# Patient Record
Sex: Male | Born: 1945 | Race: White | Hispanic: No | Marital: Single | Smoking: Former smoker
Health system: Southern US, Community
[De-identification: ages and names within clinical notes are randomized; demographics above are authoritative.]

## PROBLEM LIST (undated history)

## (undated) DIAGNOSIS — F32A Depression, unspecified: Secondary | ICD-10-CM

## (undated) DIAGNOSIS — K219 Gastro-esophageal reflux disease without esophagitis: Secondary | ICD-10-CM

## (undated) DIAGNOSIS — G459 Transient cerebral ischemic attack, unspecified: Secondary | ICD-10-CM

## (undated) DIAGNOSIS — E119 Type 2 diabetes mellitus without complications: Secondary | ICD-10-CM

## (undated) DIAGNOSIS — R7303 Prediabetes: Secondary | ICD-10-CM

## (undated) DIAGNOSIS — M1990 Unspecified osteoarthritis, unspecified site: Secondary | ICD-10-CM

## (undated) DIAGNOSIS — S12100A Unspecified displaced fracture of second cervical vertebra, initial encounter for closed fracture: Secondary | ICD-10-CM

## (undated) DIAGNOSIS — E7800 Pure hypercholesterolemia, unspecified: Secondary | ICD-10-CM

## (undated) DIAGNOSIS — H3530 Unspecified macular degeneration: Secondary | ICD-10-CM

## (undated) DIAGNOSIS — I10 Essential (primary) hypertension: Secondary | ICD-10-CM

## (undated) DIAGNOSIS — S069XAA Unspecified intracranial injury with loss of consciousness status unknown, initial encounter: Secondary | ICD-10-CM

## (undated) DIAGNOSIS — M109 Gout, unspecified: Secondary | ICD-10-CM

## (undated) DIAGNOSIS — F329 Major depressive disorder, single episode, unspecified: Secondary | ICD-10-CM

## (undated) HISTORY — PX: TRACHELECTOMY: SHX6586

## (undated) HISTORY — DX: Type 2 diabetes mellitus without complications: E11.9

## (undated) HISTORY — DX: Transient cerebral ischemic attack, unspecified: G45.9

## (undated) HISTORY — PX: EYE SURGERY: SHX253

## (undated) HISTORY — DX: Gout, unspecified: M10.9

## (undated) HISTORY — DX: Unspecified intracranial injury with loss of consciousness status unknown, initial encounter: S06.9XAA

## (undated) HISTORY — PX: BACK SURGERY: SHX140

---

## 2002-11-08 ENCOUNTER — Encounter: Admission: RE | Admit: 2002-11-08 | Discharge: 2002-11-08 | Payer: Self-pay | Admitting: Unknown Physician Specialty

## 2002-11-08 ENCOUNTER — Encounter: Payer: Self-pay | Admitting: Unknown Physician Specialty

## 2008-03-18 ENCOUNTER — Emergency Department (HOSPITAL_COMMUNITY): Admission: EM | Admit: 2008-03-18 | Discharge: 2008-03-18 | Payer: Self-pay | Admitting: Emergency Medicine

## 2014-12-26 ENCOUNTER — Inpatient Hospital Stay
Admission: AD | Admit: 2014-12-26 | Discharge: 2015-02-03 | Disposition: A | Discharge status: Skilled Nursing Facility | Payer: Self-pay | Source: Ambulatory Visit | Attending: Internal Medicine | Admitting: Internal Medicine

## 2014-12-26 ENCOUNTER — Institutional Professional Consult (permissible substitution) (HOSPITAL_COMMUNITY): Payer: Self-pay

## 2014-12-26 DIAGNOSIS — R1310 Dysphagia, unspecified: Secondary | ICD-10-CM

## 2014-12-26 DIAGNOSIS — J690 Pneumonitis due to inhalation of food and vomit: Secondary | ICD-10-CM

## 2014-12-26 DIAGNOSIS — J189 Pneumonia, unspecified organism: Secondary | ICD-10-CM

## 2014-12-26 DIAGNOSIS — J9690 Respiratory failure, unspecified, unspecified whether with hypoxia or hypercapnia: Secondary | ICD-10-CM

## 2014-12-26 DIAGNOSIS — Z431 Encounter for attention to gastrostomy: Secondary | ICD-10-CM | POA: Insufficient documentation

## 2014-12-26 DIAGNOSIS — W19XXXA Unspecified fall, initial encounter: Secondary | ICD-10-CM

## 2014-12-26 DIAGNOSIS — J9600 Acute respiratory failure, unspecified whether with hypoxia or hypercapnia: Secondary | ICD-10-CM | POA: Insufficient documentation

## 2014-12-26 LAB — BLOOD GAS, ARTERIAL
Acid-Base Excess: 2.6 mmol/L — ABNORMAL HIGH (ref 0.0–2.0)
BICARBONATE: 25.5 meq/L — AB (ref 20.0–24.0)
FIO2: 0.35
O2 Saturation: 96.9 %
PH ART: 7.517 — AB (ref 7.350–7.450)
Patient temperature: 98.6
TCO2: 26.5 mmol/L (ref 0–100)
pCO2 arterial: 31.6 mmHg — ABNORMAL LOW (ref 35.0–45.0)
pO2, Arterial: 74.1 mmHg — ABNORMAL LOW (ref 80.0–100.0)

## 2014-12-26 MED ORDER — IOHEXOL 300 MG/ML  SOLN
50.0000 mL | Freq: Once | INTRAMUSCULAR | Status: AC | PRN
  Administered 2014-12-26: 50 mL via ORAL

## 2014-12-27 ENCOUNTER — Other Ambulatory Visit (HOSPITAL_COMMUNITY): Payer: Self-pay

## 2014-12-27 DIAGNOSIS — J9811 Atelectasis: Secondary | ICD-10-CM

## 2014-12-27 DIAGNOSIS — J9621 Acute and chronic respiratory failure with hypoxia: Secondary | ICD-10-CM

## 2014-12-27 DIAGNOSIS — Z930 Tracheostomy status: Secondary | ICD-10-CM

## 2014-12-27 DIAGNOSIS — R0902 Hypoxemia: Secondary | ICD-10-CM

## 2014-12-27 LAB — COMPREHENSIVE METABOLIC PANEL
ALT: 43 U/L (ref 17–63)
AST: 19 U/L (ref 15–41)
Albumin: 2.4 g/dL — ABNORMAL LOW (ref 3.5–5.0)
Alkaline Phosphatase: 99 U/L (ref 38–126)
Anion gap: 10 (ref 5–15)
BILIRUBIN TOTAL: 0.8 mg/dL (ref 0.3–1.2)
BUN: 22 mg/dL — AB (ref 6–20)
CO2: 27 mmol/L (ref 22–32)
Calcium: 9.3 mg/dL (ref 8.9–10.3)
Chloride: 102 mmol/L (ref 101–111)
Creatinine, Ser: 0.58 mg/dL — ABNORMAL LOW (ref 0.61–1.24)
GFR calc Af Amer: >60 mL/min (ref >60)
GFR calc non Af Amer: >60 mL/min (ref >60)
Glucose, Bld: 163 mg/dL — ABNORMAL HIGH (ref 65–99)
Potassium: 4.1 mmol/L (ref 3.5–5.1)
Sodium: 139 mmol/L (ref 135–145)
TOTAL PROTEIN: 6.5 g/dL (ref 6.5–8.1)

## 2014-12-27 LAB — MAGNESIUM: MAGNESIUM: 2.3 mg/dL (ref 1.7–2.4)

## 2014-12-27 LAB — CBC
HCT: 26.9 % — ABNORMAL LOW (ref 39.0–52.0)
Hemoglobin: 8.3 g/dL — ABNORMAL LOW (ref 13.0–17.0)
MCH: 26.6 pg (ref 26.0–34.0)
MCHC: 30.9 g/dL (ref 30.0–36.0)
MCV: 86.2 fL (ref 78.0–100.0)
Platelets: 398 10*3/uL (ref 150–400)
RBC: 3.12 MIL/uL — ABNORMAL LOW (ref 4.22–5.81)
RDW: 17.2 % — ABNORMAL HIGH (ref 11.5–15.5)
WBC: 7.2 10*3/uL (ref 4.0–10.5)

## 2014-12-27 LAB — PHOSPHORUS: Phosphorus: 4.8 mg/dL — ABNORMAL HIGH (ref 2.5–4.6)
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
## 2014-12-29 DIAGNOSIS — Z431 Encounter for attention to gastrostomy: Secondary | ICD-10-CM | POA: Insufficient documentation

## 2014-12-29 DIAGNOSIS — J9600 Acute respiratory failure, unspecified whether with hypoxia or hypercapnia: Secondary | ICD-10-CM | POA: Insufficient documentation
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
## 2014-12-31 LAB — BLOOD GAS, ARTERIAL
Acid-Base Excess: 2.6 mmol/L — ABNORMAL HIGH (ref 0.0–2.0)
Bicarbonate: 25.8 mEq/L — ABNORMAL HIGH (ref 20.0–24.0)
FIO2: 0.35
O2 Saturation: 91.7 %
PCO2 ART: 33.8 mmHg — AB (ref 35.0–45.0)
PH ART: 7.494 — AB (ref 7.350–7.450)
Patient temperature: 98.6
TCO2: 26.8 mmol/L (ref 0–100)
pO2, Arterial: 64.1 mmHg — ABNORMAL LOW (ref 80.0–100.0)

## 2015-01-04 DIAGNOSIS — R911 Solitary pulmonary nodule: Secondary | ICD-10-CM

## 2015-01-04 DIAGNOSIS — J9601 Acute respiratory failure with hypoxia: Secondary | ICD-10-CM
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
## 2015-01-07 LAB — CBC WITH DIFFERENTIAL/PLATELET
Basophils Absolute: 0 10*3/uL (ref 0.0–0.1)
Basophils Relative: 1 % (ref 0–1)
EOS ABS: 0.2 10*3/uL (ref 0.0–0.7)
Eosinophils Relative: 4 % (ref 0–5)
HCT: 25.9 % — ABNORMAL LOW (ref 39.0–52.0)
Hemoglobin: 7.8 g/dL — ABNORMAL LOW (ref 13.0–17.0)
Lymphocytes Relative: 20 % (ref 12–46)
Lymphs Abs: 1 10*3/uL (ref 0.7–4.0)
MCH: 25.8 pg — ABNORMAL LOW (ref 26.0–34.0)
MCHC: 30.1 g/dL (ref 30.0–36.0)
MCV: 85.8 fL (ref 78.0–100.0)
MONOS PCT: 11 % (ref 3–12)
Monocytes Absolute: 0.5 10*3/uL (ref 0.1–1.0)
NEUTROS ABS: 3.2 10*3/uL (ref 1.7–7.7)
Neutrophils Relative %: 64 % (ref 43–77)
PLATELETS: 170 10*3/uL (ref 150–400)
RBC: 3.02 MIL/uL — ABNORMAL LOW (ref 4.22–5.81)
RDW: 18.6 % — ABNORMAL HIGH (ref 11.5–15.5)
WBC: 5 10*3/uL (ref 4.0–10.5)

## 2015-01-07 LAB — BASIC METABOLIC PANEL
Anion gap: 10 (ref 5–15)
BUN: 22 mg/dL — AB (ref 6–20)
CALCIUM: 8.7 mg/dL — AB (ref 8.9–10.3)
CHLORIDE: 99 mmol/L — AB (ref 101–111)
CO2: 25 mmol/L (ref 22–32)
Creatinine, Ser: 0.61 mg/dL (ref 0.61–1.24)
GFR calc Af Amer: >60 mL/min (ref >60)
GFR calc non Af Amer: >60 mL/min (ref >60)
Glucose, Bld: 132 mg/dL — ABNORMAL HIGH (ref 65–99)
Potassium: 4.4 mmol/L (ref 3.5–5.1)
Sodium: 134 mmol/L — ABNORMAL LOW (ref 135–145)

## 2015-01-13 LAB — HEMOGLOBIN AND HEMATOCRIT, BLOOD
HCT: 24.1 % — ABNORMAL LOW (ref 39.0–52.0)
HEMOGLOBIN: 7.5 g/dL — AB (ref 13.0–17.0)

## 2015-01-13 LAB — BASIC METABOLIC PANEL WITH GFR
Anion gap: 8 (ref 5–15)
BUN: 22 mg/dL — ABNORMAL HIGH (ref 6–20)
CO2: 25 mmol/L (ref 22–32)
Calcium: 8.8 mg/dL — ABNORMAL LOW (ref 8.9–10.3)
Chloride: 100 mmol/L — ABNORMAL LOW (ref 101–111)
Creatinine, Ser: 0.54 mg/dL — ABNORMAL LOW (ref 0.61–1.24)
GFR calc Af Amer: >60 mL/min
GFR calc non Af Amer: >60 mL/min
Glucose, Bld: 138 mg/dL — ABNORMAL HIGH (ref 65–99)
Potassium: 4.6 mmol/L (ref 3.5–5.1)
Sodium: 133 mmol/L — ABNORMAL LOW (ref 135–145)

## 2015-01-13 LAB — CBC WITH DIFFERENTIAL/PLATELET
Basophils Absolute: 0 K/uL (ref 0.0–0.1)
Basophils Relative: 0 % (ref 0–1)
Eosinophils Absolute: 0.1 K/uL (ref 0.0–0.7)
Eosinophils Relative: 2 % (ref 0–5)
HCT: 22.5 % — ABNORMAL LOW (ref 39.0–52.0)
Hemoglobin: 7 g/dL — ABNORMAL LOW (ref 13.0–17.0)
Lymphocytes Relative: 9 % — ABNORMAL LOW (ref 12–46)
Lymphs Abs: 0.7 K/uL (ref 0.7–4.0)
MCH: 26.3 pg (ref 26.0–34.0)
MCHC: 31.1 g/dL (ref 30.0–36.0)
MCV: 84.6 fL (ref 78.0–100.0)
Monocytes Absolute: 1.2 K/uL — ABNORMAL HIGH (ref 0.1–1.0)
Monocytes Relative: 16 % — ABNORMAL HIGH (ref 3–12)
Neutro Abs: 5.5 K/uL (ref 1.7–7.7)
Neutrophils Relative %: 73 % (ref 43–77)
Platelets: 162 K/uL (ref 150–400)
RBC: 2.66 MIL/uL — ABNORMAL LOW (ref 4.22–5.81)
RDW: 19.5 % — ABNORMAL HIGH (ref 11.5–15.5)
WBC: 7.5 K/uL (ref 4.0–10.5)

## 2015-01-13 LAB — PHOSPHORUS: Phosphorus: 4.2 mg/dL (ref 2.5–4.6)

## 2015-01-13 LAB — MAGNESIUM: Magnesium: 2 mg/dL (ref 1.7–2.4)

## 2015-01-14 LAB — HEMOGLOBIN AND HEMATOCRIT, BLOOD
HCT: 25 % — ABNORMAL LOW (ref 39.0–52.0)
Hemoglobin: 7.6 g/dL — ABNORMAL LOW (ref 13.0–17.0)

## 2015-01-15 ENCOUNTER — Other Ambulatory Visit (HOSPITAL_COMMUNITY): Payer: Self-pay

## 2015-01-17 ENCOUNTER — Other Ambulatory Visit (HOSPITAL_COMMUNITY): Payer: Self-pay

## 2015-01-17 LAB — CBC WITH DIFFERENTIAL/PLATELET
BASOS ABS: 0 10*3/uL (ref 0.0–0.1)
Basophils Relative: 1 % (ref 0–1)
EOS ABS: 0.2 10*3/uL (ref 0.0–0.7)
EOS PCT: 2 % (ref 0–5)
HCT: 24.8 % — ABNORMAL LOW (ref 39.0–52.0)
Hemoglobin: 7.5 g/dL — ABNORMAL LOW (ref 13.0–17.0)
LYMPHS ABS: 1 10*3/uL (ref 0.7–4.0)
LYMPHS PCT: 13 % (ref 12–46)
MCH: 24.9 pg — AB (ref 26.0–34.0)
MCHC: 30.2 g/dL (ref 30.0–36.0)
MCV: 82.4 fL (ref 78.0–100.0)
MONO ABS: 0.9 10*3/uL (ref 0.1–1.0)
Monocytes Relative: 12 % (ref 3–12)
Neutro Abs: 5.3 10*3/uL (ref 1.7–7.7)
Neutrophils Relative %: 72 % (ref 43–77)
PLATELETS: 263 10*3/uL (ref 150–400)
RBC: 3.01 MIL/uL — AB (ref 4.22–5.81)
RDW: 19.1 % — AB (ref 11.5–15.5)
WBC: 7.4 10*3/uL (ref 4.0–10.5)

## 2015-01-18 LAB — COMPREHENSIVE METABOLIC PANEL
ALT: 29 U/L (ref 17–63)
AST: 21 U/L (ref 15–41)
Albumin: 2.4 g/dL — ABNORMAL LOW (ref 3.5–5.0)
Alkaline Phosphatase: 112 U/L (ref 38–126)
Anion gap: 12 (ref 5–15)
BUN: 21 mg/dL — ABNORMAL HIGH (ref 6–20)
CHLORIDE: 98 mmol/L — AB (ref 101–111)
CO2: 22 mmol/L (ref 22–32)
Calcium: 8.8 mg/dL — ABNORMAL LOW (ref 8.9–10.3)
Creatinine, Ser: 0.57 mg/dL — ABNORMAL LOW (ref 0.61–1.24)
Glucose, Bld: 131 mg/dL — ABNORMAL HIGH (ref 65–99)
POTASSIUM: 4.2 mmol/L (ref 3.5–5.1)
SODIUM: 132 mmol/L — AB (ref 135–145)
Total Bilirubin: 0.6 mg/dL (ref 0.3–1.2)
Total Protein: 6.8 g/dL (ref 6.5–8.1)

## 2015-01-18 LAB — PHOSPHORUS: PHOSPHORUS: 4.6 mg/dL (ref 2.5–4.6)

## 2015-01-18 LAB — MAGNESIUM: MAGNESIUM: 2.1 mg/dL (ref 1.7–2.4)

## 2015-01-20 LAB — IRON AND TIBC
IRON: 20 ug/dL — AB (ref 45–182)
SATURATION RATIOS: 7 % — AB (ref 17.9–39.5)
TIBC: 270 ug/dL (ref 250–450)
UIBC: 250 ug/dL

## 2015-01-20 LAB — FERRITIN: Ferritin: 109 ng/mL (ref 24–336)

## 2015-01-22 ENCOUNTER — Other Ambulatory Visit (HOSPITAL_COMMUNITY): Payer: Self-pay

## 2015-01-22 LAB — CBC WITH DIFFERENTIAL/PLATELET
Basophils Absolute: 0 10*3/uL (ref 0.0–0.1)
Basophils Relative: 1 % (ref 0–1)
EOS ABS: 0.4 10*3/uL (ref 0.0–0.7)
EOS PCT: 7 % — AB (ref 0–5)
HCT: 25.7 % — ABNORMAL LOW (ref 39.0–52.0)
HEMOGLOBIN: 7.8 g/dL — AB (ref 13.0–17.0)
LYMPHS ABS: 0.9 10*3/uL (ref 0.7–4.0)
Lymphocytes Relative: 14 % (ref 12–46)
MCH: 24.8 pg — AB (ref 26.0–34.0)
MCHC: 30.4 g/dL (ref 30.0–36.0)
MCV: 81.8 fL (ref 78.0–100.0)
MONO ABS: 0.7 10*3/uL (ref 0.1–1.0)
MONOS PCT: 11 % (ref 3–12)
Neutro Abs: 4.5 10*3/uL (ref 1.7–7.7)
Neutrophils Relative %: 68 % (ref 43–77)
PLATELETS: 258 10*3/uL (ref 150–400)
RBC: 3.14 MIL/uL — ABNORMAL LOW (ref 4.22–5.81)
RDW: 19.9 % — AB (ref 11.5–15.5)
WBC: 6.6 10*3/uL (ref 4.0–10.5)

## 2015-01-22 LAB — BASIC METABOLIC PANEL
Anion gap: 9 (ref 5–15)
BUN: 25 mg/dL — AB (ref 6–20)
CALCIUM: 9.3 mg/dL (ref 8.9–10.3)
CHLORIDE: 96 mmol/L — AB (ref 101–111)
CO2: 28 mmol/L (ref 22–32)
CREATININE: 0.64 mg/dL (ref 0.61–1.24)
GFR calc Af Amer: >60 mL/min (ref >60)
GFR calc non Af Amer: >60 mL/min (ref >60)
Glucose, Bld: 124 mg/dL — ABNORMAL HIGH (ref 65–99)
Potassium: 4.5 mmol/L (ref 3.5–5.1)
SODIUM: 133 mmol/L — AB (ref 135–145)

## 2015-01-23 ENCOUNTER — Other Ambulatory Visit (HOSPITAL_COMMUNITY): Payer: Self-pay

## 2015-01-25 ENCOUNTER — Other Ambulatory Visit (HOSPITAL_COMMUNITY): Payer: Self-pay

## 2015-01-25 LAB — CBC WITH DIFFERENTIAL/PLATELET
BASOS ABS: 0 10*3/uL (ref 0.0–0.1)
BASOS PCT: 1 % (ref 0–1)
Eosinophils Absolute: 0.4 10*3/uL (ref 0.0–0.7)
Eosinophils Relative: 6 % — ABNORMAL HIGH (ref 0–5)
HEMATOCRIT: 23.2 % — AB (ref 39.0–52.0)
HEMOGLOBIN: 7.2 g/dL — AB (ref 13.0–17.0)
LYMPHS PCT: 11 % — AB (ref 12–46)
Lymphs Abs: 0.7 10*3/uL (ref 0.7–4.0)
MCH: 25.1 pg — ABNORMAL LOW (ref 26.0–34.0)
MCHC: 31 g/dL (ref 30.0–36.0)
MCV: 80.8 fL (ref 78.0–100.0)
MONOS PCT: 11 % (ref 3–12)
Monocytes Absolute: 0.7 10*3/uL (ref 0.1–1.0)
NEUTROS ABS: 4.7 10*3/uL (ref 1.7–7.7)
NEUTROS PCT: 71 % (ref 43–77)
Platelets: 239 10*3/uL (ref 150–400)
RBC: 2.87 MIL/uL — ABNORMAL LOW (ref 4.22–5.81)
RDW: 19.9 % — ABNORMAL HIGH (ref 11.5–15.5)
WBC: 6.5 10*3/uL (ref 4.0–10.5)

## 2015-01-25 LAB — CULTURE, RESPIRATORY W GRAM STAIN

## 2015-01-25 LAB — BASIC METABOLIC PANEL
ANION GAP: 7 (ref 5–15)
BUN: 28 mg/dL — ABNORMAL HIGH (ref 6–20)
CHLORIDE: 98 mmol/L — AB (ref 101–111)
CO2: 28 mmol/L (ref 22–32)
Calcium: 9.1 mg/dL (ref 8.9–10.3)
Creatinine, Ser: 0.67 mg/dL (ref 0.61–1.24)
GFR calc non Af Amer: >60 mL/min (ref >60)
Glucose, Bld: 121 mg/dL — ABNORMAL HIGH (ref 65–99)
Potassium: 4.9 mmol/L (ref 3.5–5.1)
Sodium: 133 mmol/L — ABNORMAL LOW (ref 135–145)

## 2015-01-25 LAB — CULTURE, RESPIRATORY

## 2015-01-26 LAB — ABO/RH: ABO/RH(D): O POS

## 2015-01-26 LAB — HEMOGLOBIN AND HEMATOCRIT, BLOOD
HEMATOCRIT: 23.1 % — AB (ref 39.0–52.0)
Hemoglobin: 7.1 g/dL — ABNORMAL LOW (ref 13.0–17.0)

## 2015-01-26 LAB — PREPARE RBC (CROSSMATCH)

## 2015-01-27 LAB — TYPE AND SCREEN
ABO/RH(D): O POS
Antibody Screen: NEGATIVE
Unit division: 0

## 2015-01-27 LAB — HEMOGLOBIN AND HEMATOCRIT, BLOOD
HCT: 28.7 % — ABNORMAL LOW (ref 39.0–52.0)
HEMOGLOBIN: 9.1 g/dL — AB (ref 13.0–17.0)

## 2015-01-28 LAB — BASIC METABOLIC PANEL
ANION GAP: 7 (ref 5–15)
BUN: 21 mg/dL — ABNORMAL HIGH (ref 6–20)
CHLORIDE: 99 mmol/L — AB (ref 101–111)
CO2: 29 mmol/L (ref 22–32)
Calcium: 9.2 mg/dL (ref 8.9–10.3)
Creatinine, Ser: 0.66 mg/dL (ref 0.61–1.24)
GFR calc Af Amer: >60 mL/min (ref >60)
Glucose, Bld: 109 mg/dL — ABNORMAL HIGH (ref 65–99)
POTASSIUM: 4 mmol/L (ref 3.5–5.1)
SODIUM: 135 mmol/L (ref 135–145)

## 2015-01-28 LAB — CBC WITH DIFFERENTIAL/PLATELET
BASOS ABS: 0 10*3/uL (ref 0.0–0.1)
Basophils Relative: 1 % (ref 0–1)
EOS ABS: 0.4 10*3/uL (ref 0.0–0.7)
EOS PCT: 6 % — AB (ref 0–5)
HCT: 27.8 % — ABNORMAL LOW (ref 39.0–52.0)
HEMOGLOBIN: 8.6 g/dL — AB (ref 13.0–17.0)
LYMPHS ABS: 1.1 10*3/uL (ref 0.7–4.0)
LYMPHS PCT: 17 % (ref 12–46)
MCH: 25.1 pg — ABNORMAL LOW (ref 26.0–34.0)
MCHC: 30.9 g/dL (ref 30.0–36.0)
MCV: 81 fL (ref 78.0–100.0)
Monocytes Absolute: 0.6 10*3/uL (ref 0.1–1.0)
Monocytes Relative: 10 % (ref 3–12)
NEUTROS PCT: 66 % (ref 43–77)
Neutro Abs: 4.1 10*3/uL (ref 1.7–7.7)
PLATELETS: 219 10*3/uL (ref 150–400)
RBC: 3.43 MIL/uL — AB (ref 4.22–5.81)
RDW: 19 % — ABNORMAL HIGH (ref 11.5–15.5)
WBC: 6.1 10*3/uL (ref 4.0–10.5)

## 2015-01-28 LAB — MAGNESIUM: MAGNESIUM: 2.2 mg/dL (ref 1.7–2.4)

## 2015-01-28 LAB — PHOSPHORUS: PHOSPHORUS: 4.3 mg/dL (ref 2.5–4.6)

## 2015-01-29 LAB — CBC WITH DIFFERENTIAL/PLATELET
BASOS ABS: 0.1 10*3/uL (ref 0.0–0.1)
Basophils Relative: 1 % (ref 0–1)
EOS PCT: 5 % (ref 0–5)
Eosinophils Absolute: 0.3 10*3/uL (ref 0.0–0.7)
HCT: 29.2 % — ABNORMAL LOW (ref 39.0–52.0)
Hemoglobin: 9 g/dL — ABNORMAL LOW (ref 13.0–17.0)
LYMPHS PCT: 13 % (ref 12–46)
Lymphs Abs: 0.8 10*3/uL (ref 0.7–4.0)
MCH: 25.5 pg — ABNORMAL LOW (ref 26.0–34.0)
MCHC: 30.8 g/dL (ref 30.0–36.0)
MCV: 82.7 fL (ref 78.0–100.0)
Monocytes Absolute: 0.6 10*3/uL (ref 0.1–1.0)
Monocytes Relative: 9 % (ref 3–12)
Neutro Abs: 4.4 10*3/uL (ref 1.7–7.7)
Neutrophils Relative %: 72 % (ref 43–77)
PLATELETS: 216 10*3/uL (ref 150–400)
RBC: 3.53 MIL/uL — AB (ref 4.22–5.81)
RDW: 19.1 % — ABNORMAL HIGH (ref 11.5–15.5)
WBC: 6.1 10*3/uL (ref 4.0–10.5)

## 2015-01-31 LAB — BASIC METABOLIC PANEL
ANION GAP: 8 (ref 5–15)
BUN: 22 mg/dL — ABNORMAL HIGH (ref 6–20)
CO2: 24 mmol/L (ref 22–32)
Calcium: 9.3 mg/dL (ref 8.9–10.3)
Chloride: 104 mmol/L (ref 101–111)
Creatinine, Ser: 0.59 mg/dL — ABNORMAL LOW (ref 0.61–1.24)
Glucose, Bld: 101 mg/dL — ABNORMAL HIGH (ref 65–99)
Potassium: 4.2 mmol/L (ref 3.5–5.1)
Sodium: 136 mmol/L (ref 135–145)

## 2015-01-31 LAB — CBC WITH DIFFERENTIAL/PLATELET
BASOS ABS: 0.1 10*3/uL (ref 0.0–0.1)
BASOS PCT: 1 % (ref 0–1)
Eosinophils Absolute: 0.2 10*3/uL (ref 0.0–0.7)
Eosinophils Relative: 4 % (ref 0–5)
HEMATOCRIT: 29.2 % — AB (ref 39.0–52.0)
HEMOGLOBIN: 9.2 g/dL — AB (ref 13.0–17.0)
Lymphocytes Relative: 12 % (ref 12–46)
Lymphs Abs: 0.7 10*3/uL (ref 0.7–4.0)
MCH: 26.1 pg (ref 26.0–34.0)
MCHC: 31.5 g/dL (ref 30.0–36.0)
MCV: 82.7 fL (ref 78.0–100.0)
MONO ABS: 0.7 10*3/uL (ref 0.1–1.0)
Monocytes Relative: 11 % (ref 3–12)
NEUTROS ABS: 4.4 10*3/uL (ref 1.7–7.7)
NEUTROS PCT: 72 % (ref 43–77)
Platelets: 200 10*3/uL (ref 150–400)
RBC: 3.53 MIL/uL — AB (ref 4.22–5.81)
RDW: 19 % — AB (ref 11.5–15.5)
WBC: 6 10*3/uL (ref 4.0–10.5)

## 2015-02-02 ENCOUNTER — Other Ambulatory Visit (HOSPITAL_COMMUNITY): Payer: Medicare Other

## 2015-02-02 LAB — CBC WITH DIFFERENTIAL/PLATELET
BASOS ABS: 0 10*3/uL (ref 0.0–0.1)
Basophils Relative: 1 % (ref 0–1)
Eosinophils Absolute: 0.3 10*3/uL (ref 0.0–0.7)
Eosinophils Relative: 5 % (ref 0–5)
HCT: 29.3 % — ABNORMAL LOW (ref 39.0–52.0)
HEMOGLOBIN: 8.8 g/dL — AB (ref 13.0–17.0)
LYMPHS ABS: 0.9 10*3/uL (ref 0.7–4.0)
LYMPHS PCT: 16 % (ref 12–46)
MCH: 24.9 pg — AB (ref 26.0–34.0)
MCHC: 30 g/dL (ref 30.0–36.0)
MCV: 82.8 fL (ref 78.0–100.0)
Monocytes Absolute: 0.5 10*3/uL (ref 0.1–1.0)
Monocytes Relative: 8 % (ref 3–12)
NEUTROS ABS: 4 10*3/uL (ref 1.7–7.7)
NEUTROS PCT: 70 % (ref 43–77)
Platelets: 174 10*3/uL (ref 150–400)
RBC: 3.54 MIL/uL — AB (ref 4.22–5.81)
RDW: 19.1 % — ABNORMAL HIGH (ref 11.5–15.5)
WBC: 5.6 10*3/uL (ref 4.0–10.5)

## 2015-02-02 LAB — BASIC METABOLIC PANEL
ANION GAP: 8 (ref 5–15)
BUN: 25 mg/dL — AB (ref 6–20)
CHLORIDE: 100 mmol/L — AB (ref 101–111)
CO2: 27 mmol/L (ref 22–32)
Calcium: 9.2 mg/dL (ref 8.9–10.3)
Creatinine, Ser: 0.6 mg/dL — ABNORMAL LOW (ref 0.61–1.24)
GFR calc Af Amer: >60 mL/min (ref >60)
GLUCOSE: 114 mg/dL — AB (ref 65–99)
POTASSIUM: 4.2 mmol/L (ref 3.5–5.1)
SODIUM: 135 mmol/L (ref 135–145)

## 2015-03-18 ENCOUNTER — Emergency Department: Payer: Medicare Other

## 2015-03-18 ENCOUNTER — Emergency Department
Admission: EM | Admit: 2015-03-18 | Discharge: 2015-03-18 | Disposition: A | Discharge status: Home / Self Care | Payer: Medicare Other | Attending: Student | Admitting: Student

## 2015-03-18 DIAGNOSIS — J9500 Unspecified tracheostomy complication: Secondary | ICD-10-CM | POA: Diagnosis not present

## 2015-03-18 DIAGNOSIS — Z430 Encounter for attention to tracheostomy: Secondary | ICD-10-CM | POA: Diagnosis present
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
## 2015-03-18 NOTE — Progress Notes (Signed)
Received pt. decannulated on room air,sat 96 without resp. distress. RT unable to place original # 6 cuffless shiley that pt. Came in with. RT placed a # 4 cuffless shiley in ,suctioned pt. For moderate amount of lite yellow thick secretions post placement of thach. . Passy muir valve pt. Came in with was placed back. RT left pt. With no resp. Distress,hr 88,sat 98,rr18.
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
## 2015-03-24 ENCOUNTER — Emergency Department
Admission: EM | Admit: 2015-03-24 | Discharge: 2015-03-24 | Disposition: A | Discharge status: Home / Self Care | Payer: Medicare Other | Attending: Emergency Medicine | Admitting: Emergency Medicine

## 2015-03-24 ENCOUNTER — Encounter: Payer: Self-pay | Admitting: Emergency Medicine

## 2015-03-24 DIAGNOSIS — Z87891 Personal history of nicotine dependence: Secondary | ICD-10-CM | POA: Diagnosis not present

## 2015-03-24 DIAGNOSIS — I10 Essential (primary) hypertension: Secondary | ICD-10-CM | POA: Diagnosis not present

## 2015-03-24 DIAGNOSIS — Z430 Encounter for attention to tracheostomy: Secondary | ICD-10-CM

## 2015-03-24 HISTORY — DX: Unspecified displaced fracture of second cervical vertebra, initial encounter for closed fracture: S12.100A

## 2015-03-24 HISTORY — DX: Essential (primary) hypertension: I10

## 2015-03-24 NOTE — Discharge Instructions (Signed)
We have removed her trache[facility] today [facility]. Please note that you need to place a Vaseline dressing twice daily over the site until it heals. Make sure that you return to the emergency room right away if you have any trouble breathing, he noticed that you're "wheezing", he feel you're having any difficulty with shortness of breath,  develop any pain in the front of your neck, or is any redness swelling or signs of infection.  Please [facility] closely [facility] or you may attempt to set up follow-up with [place] [facility].  Please continue to use your cervical collar, and [facility].

## 2015-03-24 NOTE — ED Notes (Signed)
Pt with trach stoma (trach out) with dsg. No s/s of infection. Pt able to breath with stoma occluded. Pt also with old feeding tube site left abd. No s/s of infection.
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
## 2015-03-24 NOTE — ED Notes (Signed)
Ems from [facility] due to trach coming out. Pt talking, o2 sats 99% RA. [place] J collar on.
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
## 2017-02-19 DIAGNOSIS — R0600 Dyspnea, unspecified: Secondary | ICD-10-CM | POA: Diagnosis not present

## 2017-02-19 DIAGNOSIS — R0602 Shortness of breath: Secondary | ICD-10-CM | POA: Diagnosis not present

## 2017-02-19 DIAGNOSIS — I160 Hypertensive urgency: Secondary | ICD-10-CM

## 2017-02-20 DIAGNOSIS — I160 Hypertensive urgency: Secondary | ICD-10-CM | POA: Diagnosis not present

## 2017-02-20 DIAGNOSIS — R0600 Dyspnea, unspecified: Secondary | ICD-10-CM | POA: Diagnosis not present

## 2017-12-07 DIAGNOSIS — I447 Left bundle-branch block, unspecified: Secondary | ICD-10-CM

## 2017-12-07 DIAGNOSIS — R112 Nausea with vomiting, unspecified: Secondary | ICD-10-CM

## 2017-12-08 DIAGNOSIS — R112 Nausea with vomiting, unspecified: Secondary | ICD-10-CM | POA: Diagnosis not present

## 2017-12-08 DIAGNOSIS — R079 Chest pain, unspecified: Secondary | ICD-10-CM

## 2017-12-08 DIAGNOSIS — I447 Left bundle-branch block, unspecified: Secondary | ICD-10-CM | POA: Diagnosis not present

## 2018-04-14 ENCOUNTER — Other Ambulatory Visit (HOSPITAL_COMMUNITY): Payer: Self-pay | Admitting: *Deleted

## 2018-04-14 NOTE — Patient Instructions (Signed)
Bruce Gates  04/14/2018   Your procedure is scheduled on: 04-27-18  Report to [facility] Main  Entrance  Report to admitting at 1220 PM    Call this number if you have problems the morning of surgery [phone]   Remember: Do not eat food  :After Midnight. C;EAR LIQUIDS FROM MIDNIGHT UNTIL 850 AM DAY OF SURGERY. NOTHING BY MOUTH AFTER 850 AM DAY OF SURGERY. BRUSH YOUR TEETH MORNING OF SURGERY AND RINSE YOUR MOUTH OUT, NO CHEWING GUM CANDY OR MINTS.     CLEAR LIQUID DIET   Foods Allowed                                                                     Foods Excluded  Coffee and tea, regular and decaf                             liquids that you cannot  Plain Jell-O in any flavor                                             see through such as: Fruit ices (not with fruit pulp)                                     milk, soups, orange juice  Iced Popsicles                                    All solid food Carbonated beverages, regular and diet                                    Cranberry, grape and apple juices Sports drinks like Gatorade Lightly seasoned clear broth or consume(fat free) Sugar, honey syrup  Sample Menu Breakfast                                Lunch                                     Supper Cranberry juice                    Beef broth                            Chicken broth Jell-O                                     Grape juice  Apple juice Coffee or tea                        Jell-O                                      Popsicle                                                Coffee or tea                        Coffee or tea  _____________________________________________________________________     Take these medicines the morning of surgery with A SIP OF WATER: GABAPENTIN (NEURONTI N), SERTRALINE (ZOLOFT), CLONIDINE (CATAPRES), METOPROLOL TARTRATE, ALLOPURINOL, OMEPRAZOLE (PRILOSEC)                          You may not have any metal on your body including hair pins and              piercings  Do not wear jewelry, make-up, lotions, powders or perfumes, deodorant             Do not wear nail polish.  Do not shave  48 hours prior to surgery.              Men may shave face and neck.   Do not bring valuables to the hospital. [facility] IS NOT             RESPONSIBLE   FOR VALUABLES.  Contacts, dentures or bridgework may not be worn into surgery.  Leave suitcase in the car. After surgery it may be brought to your room.                  Please read over the following fact sheets you were given: _____________________________________________________________________   [facility] - Preparing for Surgery Before surgery, you can play an important role.  Because skin is not sterile, your skin needs to be as free of germs as possible.  You can reduce the number of germs on your skin by washing with CHG (chlorahexidine gluconate) soap before surgery.  CHG is an antiseptic cleaner which kills germs and bonds with the skin to continue killing germs even after washing. Please DO NOT use if you have an allergy to CHG or antibacterial soaps.  If your skin becomes reddened/irritated stop using the CHG and inform your nurse when you arrive at Short Stay. Do not shave (including legs and underarms) for at least 48 hours prior to the first CHG shower.  You may shave your face/neck. Please follow these instructions carefully:  1.  Shower with CHG Soap the night before surgery and the  morning of Surgery.  2.  If you choose to wash your hair, wash your hair first as usual with your  normal  shampoo.  3.  After you shampoo, rinse your hair and body thoroughly to remove the  shampoo.                           4.  Use CHG as you would any other liquid soap.  You can apply  chg directly  to the skin and wash                       Gently with a scrungie or clean washcloth.  5.  Apply the CHG Soap to your body ONLY  FROM THE NECK DOWN.   Do not use on face/ open                           Wound or open sores. Avoid contact with eyes, ears mouth and genitals (private parts).                       Wash face,  Genitals (private parts) with your normal soap.             6.  Wash thoroughly, paying special attention to the area where your surgery  will be performed.  7.  Thoroughly rinse your body with warm water from the neck down.  8.  DO NOT shower/wash with your normal soap after using and rinsing off  the CHG Soap.                9.  Pat yourself dry with a clean towel.            10.  Wear clean pajamas.            11.  Place clean sheets on your bed the night of your first shower and do not  sleep with pets. Day of Surgery : Do not apply any lotions/deodorants the morning of surgery.  Please wear clean clothes to the hospital/surgery center.  FAILURE TO FOLLOW THESE INSTRUCTIONS MAY RESULT IN THE CANCELLATION OF YOUR SURGERY PATIENT SIGNATURE_________________________________  NURSE SIGNATURE__________________________________  ________________________________________________________________________   Bruce MireIncentive Spirometer  An incentive spirometer is a tool that can help keep your lungs clear and active. This tool measures how well you are filling your lungs with each breath. Taking long deep breaths may help reverse or decrease the chance of developing breathing (pulmonary) problems (especially infection) following:  A long period of time when you are unable to move or be active. BEFORE THE PROCEDURE   If the spirometer includes an indicator to show your best effort, your nurse or respiratory therapist will set it to a desired goal.  If possible, sit up straight or lean slightly forward. Try not to slouch.  Hold the incentive spirometer in an upright position. INSTRUCTIONS FOR USE  1. Sit on the edge of your bed if possible, or sit up as far as you can in bed or on a chair. 2. Hold the incentive  spirometer in an upright position. 3. Breathe out normally. 4. Place the mouthpiece in your mouth and seal your lips tightly around it. 5. Breathe in slowly and as deeply as possible, raising the piston or the ball toward the top of the column. 6. Hold your breath for 3-5 seconds or for as long as possible. Allow the piston or ball to fall to the bottom of the column. 7. Remove the mouthpiece from your mouth and breathe out normally. 8. Rest for a few seconds and repeat Steps 1 through 7 at least 10 times every 1-2 hours when you are awake. Take your time and take a few normal breaths between deep breaths. 9. The spirometer may include an indicator to show your best effort. Use the indicator as a goal to work toward during each  repetition. 10. After each set of 10 deep breaths, practice coughing to be sure your lungs are clear. If you have an incision (the cut made at the time of surgery), support your incision when coughing by placing a pillow or rolled up towels firmly against it. Once you are able to get out of bed, walk around indoors and cough well. You may stop using the incentive spirometer when instructed by your caregiver.  RISKS AND COMPLICATIONS  Take your time so you do not get dizzy or light-headed.  If you are in pain, you may need to take or ask for pain medication before doing incentive spirometry. It is harder to take a deep breath if you are having pain. AFTER USE  Rest and breathe slowly and easily.  It can be helpful to keep track of a log of your progress. Your caregiver can provide you with a simple table to help with this. If you are using the spirometer at home, follow these instructions: SEEK MEDICAL CARE IF:   You are having difficultly using the spirometer.  You have trouble using the spirometer as often as instructed.  Your pain medication is not giving enough relief while using the spirometer.  You develop fever of 100.5 F (38.1 C) or higher. SEEK  IMMEDIATE MEDICAL CARE IF:   You cough up bloody sputum that had not been present before.  You develop fever of 102 F (38.9 C) or greater.  You develop worsening pain at or near the incision site. MAKE SURE YOU:   Understand these instructions.  Will watch your condition.  Will get help right away if you are not doing well or get worse. Document Released: 09/30/2006 Document Revised: 08/12/2011 Document Reviewed: 12/01/2006 [facility] Patient Information 2014 [place], [place].   ________________________________________________________________________

## 2018-04-16 ENCOUNTER — Encounter (HOSPITAL_COMMUNITY)
Admission: RE | Admit: 2018-04-16 | Discharge: 2018-04-16 | Disposition: A | Discharge status: Home / Self Care | Payer: Medicare Other | Source: Ambulatory Visit | Attending: Orthopedic Surgery | Admitting: Orthopedic Surgery

## 2018-04-16 ENCOUNTER — Encounter (HOSPITAL_COMMUNITY): Payer: Self-pay | Admitting: *Deleted

## 2018-04-16 ENCOUNTER — Other Ambulatory Visit: Payer: Self-pay | Admitting: Orthopedic Surgery

## 2018-04-16 ENCOUNTER — Encounter (HOSPITAL_COMMUNITY): Admission: RE | Admit: 2018-04-16 | Payer: Medicare Other | Source: Ambulatory Visit

## 2018-04-16 ENCOUNTER — Other Ambulatory Visit: Payer: Self-pay

## 2018-04-16 DIAGNOSIS — Z01812 Encounter for preprocedural laboratory examination: Secondary | ICD-10-CM | POA: Insufficient documentation

## 2018-04-16 LAB — COMPREHENSIVE METABOLIC PANEL
ALT: 15 U/L (ref 0–44)
AST: 19 U/L (ref 15–41)
Albumin: 4.4 g/dL (ref 3.5–5.0)
Alkaline Phosphatase: 72 U/L (ref 38–126)
Anion gap: 8 (ref 5–15)
BILIRUBIN TOTAL: 1.7 mg/dL — AB (ref 0.3–1.2)
BUN: 11 mg/dL (ref 8–23)
CHLORIDE: 102 mmol/L (ref 98–111)
CO2: 30 mmol/L (ref 22–32)
Calcium: 9.5 mg/dL (ref 8.9–10.3)
Creatinine, Ser: 0.89 mg/dL (ref 0.61–1.24)
GFR calc Af Amer: >60 mL/min (ref >60)
Glucose, Bld: 156 mg/dL — ABNORMAL HIGH (ref 70–99)
POTASSIUM: 4.1 mmol/L (ref 3.5–5.1)
Sodium: 140 mmol/L (ref 135–145)
TOTAL PROTEIN: 7.9 g/dL (ref 6.5–8.1)

## 2018-04-16 LAB — CBC
HEMATOCRIT: 39.3 % (ref 39.0–52.0)
Hemoglobin: 12.7 g/dL — ABNORMAL LOW (ref 13.0–17.0)
MCH: 28.5 pg (ref 26.0–34.0)
MCHC: 32.3 g/dL (ref 30.0–36.0)
MCV: 88.1 fL (ref 80.0–100.0)
NRBC: 0 % (ref 0.0–0.2)
PLATELETS: 153 10*3/uL (ref 150–400)
RBC: 4.46 MIL/uL (ref 4.22–5.81)
RDW: 17.1 % — AB (ref 11.5–15.5)
WBC: 5.8 10*3/uL (ref 4.0–10.5)

## 2018-04-16 LAB — PROTIME-INR
INR: 1
Prothrombin Time: 13.1 seconds (ref 11.4–15.2)

## 2018-04-16 LAB — TYPE AND SCREEN
ABO/RH(D): O POS
ANTIBODY SCREEN: NEGATIVE

## 2018-04-16 LAB — ABO/RH: ABO/RH(D): O POS

## 2018-04-16 LAB — SURGICAL PCR SCREEN
MRSA, PCR: POSITIVE — AB
STAPHYLOCOCCUS AUREUS: POSITIVE — AB

## 2018-04-16 LAB — APTT: aPTT: 29 seconds (ref 24–36)

## 2018-04-16 NOTE — Care Plan (Signed)
Ortho Bundle  R THA on 04-27-18 DCP:  Home alone.  Dtr will be checking in DME:  Has a RW.  3-in-1 ordered through [employer] PT:  HEP
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
## 2018-04-24 ENCOUNTER — Other Ambulatory Visit (HOSPITAL_COMMUNITY): Payer: Medicare Other

## 2018-04-27 ENCOUNTER — Other Ambulatory Visit: Payer: Self-pay

## 2018-04-27 ENCOUNTER — Encounter (HOSPITAL_COMMUNITY): Payer: Self-pay | Admitting: *Deleted

## 2018-04-27 ENCOUNTER — Inpatient Hospital Stay (HOSPITAL_COMMUNITY)
Admission: RE | Admit: 2018-04-27 | Discharge: 2018-04-29 | DRG: 470 | Disposition: A | Discharge status: Home Health | Payer: Medicare Other | Source: Ambulatory Visit | Attending: Orthopedic Surgery | Admitting: Orthopedic Surgery

## 2018-04-27 ENCOUNTER — Inpatient Hospital Stay (HOSPITAL_COMMUNITY): Payer: Medicare Other | Admitting: Anesthesiology

## 2018-04-27 ENCOUNTER — Inpatient Hospital Stay (HOSPITAL_COMMUNITY): Payer: Medicare Other

## 2018-04-27 ENCOUNTER — Encounter (HOSPITAL_COMMUNITY)
Admission: RE | Disposition: A | Discharge status: Home Health | Payer: Self-pay | Source: Ambulatory Visit | Attending: Orthopedic Surgery

## 2018-04-27 DIAGNOSIS — Z79899 Other long term (current) drug therapy: Secondary | ICD-10-CM

## 2018-04-27 DIAGNOSIS — E7800 Pure hypercholesterolemia, unspecified: Secondary | ICD-10-CM | POA: Diagnosis present

## 2018-04-27 DIAGNOSIS — Z419 Encounter for procedure for purposes other than remedying health state, unspecified: Secondary | ICD-10-CM

## 2018-04-27 DIAGNOSIS — J449 Chronic obstructive pulmonary disease, unspecified: Secondary | ICD-10-CM | POA: Diagnosis present

## 2018-04-27 DIAGNOSIS — M1611 Unilateral primary osteoarthritis, right hip: Secondary | ICD-10-CM | POA: Diagnosis present

## 2018-04-27 DIAGNOSIS — H3530 Unspecified macular degeneration: Secondary | ICD-10-CM | POA: Diagnosis present

## 2018-04-27 DIAGNOSIS — Z981 Arthrodesis status: Secondary | ICD-10-CM | POA: Diagnosis not present

## 2018-04-27 DIAGNOSIS — R7303 Prediabetes: Secondary | ICD-10-CM | POA: Diagnosis present

## 2018-04-27 DIAGNOSIS — I10 Essential (primary) hypertension: Secondary | ICD-10-CM | POA: Diagnosis present

## 2018-04-27 DIAGNOSIS — K219 Gastro-esophageal reflux disease without esophagitis: Secondary | ICD-10-CM | POA: Diagnosis present

## 2018-04-27 DIAGNOSIS — Z87891 Personal history of nicotine dependence: Secondary | ICD-10-CM

## 2018-04-27 DIAGNOSIS — Z96649 Presence of unspecified artificial hip joint: Secondary | ICD-10-CM

## 2018-04-27 DIAGNOSIS — M169 Osteoarthritis of hip, unspecified: Secondary | ICD-10-CM

## 2018-04-27 HISTORY — DX: Pure hypercholesterolemia, unspecified: E78.00

## 2018-04-27 HISTORY — DX: Depression, unspecified: F32.A

## 2018-04-27 HISTORY — DX: Unspecified osteoarthritis, unspecified site: M19.90

## 2018-04-27 HISTORY — DX: Unspecified macular degeneration: H35.30

## 2018-04-27 HISTORY — PX: TOTAL HIP ARTHROPLASTY: SHX124

## 2018-04-27 HISTORY — DX: Gastro-esophageal reflux disease without esophagitis: K21.9

## 2018-04-27 HISTORY — DX: Prediabetes: R73.03

## 2018-04-27 HISTORY — DX: Major depressive disorder, single episode, unspecified: F32.9

## 2018-04-27 SURGERY — ARTHROPLASTY, HIP, TOTAL, ANTERIOR APPROACH
Anesthesia: Spinal | Site: Hip | Laterality: Right

## 2018-04-27 MED ORDER — MORPHINE SULFATE (PF) 2 MG/ML IV SOLN
0.5000 mg | INTRAVENOUS | Status: DC | PRN
  Administered 2018-04-27: 1 mg via INTRAVENOUS
  Filled 2018-04-27: qty 1

## 2018-04-27 MED ORDER — SODIUM CHLORIDE 0.9 % IR SOLN
Status: DC | PRN
  Administered 2018-04-27: 1000 mL

## 2018-04-27 MED ORDER — METOPROLOL TARTRATE 12.5 MG HALF TABLET
12.5000 mg | ORAL_TABLET | Freq: Two times a day (BID) | ORAL | Status: DC
  Administered 2018-04-27 – 2018-04-29 (×4): 12.5 mg via ORAL
  Filled 2018-04-27 (×4): qty 1

## 2018-04-27 MED ORDER — METHOCARBAMOL 500 MG PO TABS
500.0000 mg | ORAL_TABLET | Freq: Four times a day (QID) | ORAL | Status: DC | PRN
  Administered 2018-04-27 – 2018-04-28 (×3): 500 mg via ORAL
  Filled 2018-04-27 (×3): qty 1

## 2018-04-27 MED ORDER — BUPIVACAINE IN DEXTROSE 0.75-8.25 % IT SOLN
INTRATHECAL | Status: DC | PRN
  Administered 2018-04-27: 2 mL via INTRATHECAL

## 2018-04-27 MED ORDER — FENTANYL CITRATE (PF) 100 MCG/2ML IJ SOLN: 25.0000 ug | INTRAMUSCULAR | Status: DC | PRN

## 2018-04-27 MED ORDER — CEFAZOLIN SODIUM-DEXTROSE 2-4 GM/100ML-% IV SOLN
2.0000 g | Freq: Four times a day (QID) | INTRAVENOUS | Status: AC
  Administered 2018-04-27 – 2018-04-28 (×2): 2 g via INTRAVENOUS
  Filled 2018-04-27 (×2): qty 100

## 2018-04-27 MED ORDER — DEXAMETHASONE SODIUM PHOSPHATE 10 MG/ML IJ SOLN
INTRAMUSCULAR | Status: AC
  Filled 2018-04-27: qty 1

## 2018-04-27 MED ORDER — SERTRALINE HCL 50 MG PO TABS
50.0000 mg | ORAL_TABLET | Freq: Every day | ORAL | Status: DC
  Administered 2018-04-28 – 2018-04-29 (×2): 50 mg via ORAL
  Filled 2018-04-27 (×2): qty 1

## 2018-04-27 MED ORDER — PROPOFOL 500 MG/50ML IV EMUL
INTRAVENOUS | Status: DC | PRN
  Administered 2018-04-27: 50 ug/kg/min via INTRAVENOUS

## 2018-04-27 MED ORDER — ONDANSETRON HCL 4 MG/2ML IJ SOLN: 4.0000 mg | Freq: Once | INTRAMUSCULAR | Status: DC | PRN

## 2018-04-27 MED ORDER — TRANEXAMIC ACID-NACL 1000-0.7 MG/100ML-% IV SOLN
1000.0000 mg | Freq: Once | INTRAVENOUS | Status: AC
  Administered 2018-04-27: 1000 mg via INTRAVENOUS
  Filled 2018-04-27: qty 100

## 2018-04-27 MED ORDER — LACTATED RINGERS IV SOLN
INTRAVENOUS | Status: DC
  Administered 2018-04-27 (×2): via INTRAVENOUS

## 2018-04-27 MED ORDER — FLEET ENEMA 7-19 GM/118ML RE ENEM: 1.0000 | ENEMA | Freq: Once | RECTAL | Status: DC | PRN

## 2018-04-27 MED ORDER — DOCUSATE SODIUM 100 MG PO CAPS
100.0000 mg | ORAL_CAPSULE | Freq: Two times a day (BID) | ORAL | Status: DC
  Administered 2018-04-27 – 2018-04-29 (×4): 100 mg via ORAL
  Filled 2018-04-27 (×4): qty 1

## 2018-04-27 MED ORDER — HYDROCODONE-ACETAMINOPHEN 7.5-325 MG PO TABS
1.0000 | ORAL_TABLET | ORAL | Status: DC | PRN
  Administered 2018-04-28: 1 via ORAL
  Administered 2018-04-28: 2 via ORAL
  Filled 2018-04-27 (×2): qty 2

## 2018-04-27 MED ORDER — OXYCODONE HCL 5 MG PO TABS: 5.0000 mg | ORAL_TABLET | Freq: Once | ORAL | Status: DC | PRN

## 2018-04-27 MED ORDER — METHOCARBAMOL 500 MG IVPB - SIMPLE MED
500.0000 mg | Freq: Four times a day (QID) | INTRAVENOUS | Status: DC | PRN
  Filled 2018-04-27: qty 50

## 2018-04-27 MED ORDER — ONDANSETRON HCL 4 MG PO TABS: 4.0000 mg | ORAL_TABLET | Freq: Four times a day (QID) | ORAL | Status: DC | PRN

## 2018-04-27 MED ORDER — GABAPENTIN 300 MG PO CAPS
600.0000 mg | ORAL_CAPSULE | Freq: Three times a day (TID) | ORAL | Status: DC
  Administered 2018-04-27 – 2018-04-29 (×5): 600 mg via ORAL
  Filled 2018-04-27 (×5): qty 2

## 2018-04-27 MED ORDER — PROPOFOL 500 MG/50ML IV EMUL
INTRAVENOUS | Status: DC | PRN
  Administered 2018-04-27: 20 mg via INTRAVENOUS
  Administered 2018-04-27: 15 mg via INTRAVENOUS

## 2018-04-27 MED ORDER — CHLORHEXIDINE GLUCONATE 4 % EX LIQD
60.0000 mL | Freq: Once | CUTANEOUS | Status: AC
  Administered 2018-04-27: 4 via TOPICAL

## 2018-04-27 MED ORDER — ALLOPURINOL 100 MG PO TABS
100.0000 mg | ORAL_TABLET | Freq: Every day | ORAL | Status: DC
  Administered 2018-04-28 – 2018-04-29 (×2): 100 mg via ORAL
  Filled 2018-04-27 (×2): qty 1

## 2018-04-27 MED ORDER — DEXAMETHASONE SODIUM PHOSPHATE 10 MG/ML IJ SOLN
10.0000 mg | Freq: Once | INTRAMUSCULAR | Status: AC
  Administered 2018-04-28: 10 mg via INTRAVENOUS
  Filled 2018-04-27: qty 1

## 2018-04-27 MED ORDER — BUPIVACAINE-EPINEPHRINE (PF) 0.25% -1:200000 IJ SOLN
INTRAMUSCULAR | Status: AC
  Filled 2018-04-27: qty 30

## 2018-04-27 MED ORDER — PROPOFOL 10 MG/ML IV BOLUS
INTRAVENOUS | Status: AC
  Filled 2018-04-27: qty 40

## 2018-04-27 MED ORDER — ASPIRIN EC 325 MG PO TBEC
325.0000 mg | DELAYED_RELEASE_TABLET | Freq: Two times a day (BID) | ORAL | Status: DC
  Administered 2018-04-28 – 2018-04-29 (×3): 325 mg via ORAL
  Filled 2018-04-27 (×3): qty 1

## 2018-04-27 MED ORDER — PANTOPRAZOLE SODIUM 40 MG PO TBEC
40.0000 mg | DELAYED_RELEASE_TABLET | Freq: Every day | ORAL | Status: DC
  Administered 2018-04-28 – 2018-04-29 (×2): 40 mg via ORAL
  Filled 2018-04-27 (×2): qty 1

## 2018-04-27 MED ORDER — VANCOMYCIN HCL IN DEXTROSE 1-5 GM/200ML-% IV SOLN
1000.0000 mg | INTRAVENOUS | Status: AC
  Administered 2018-04-27: 1000 mg via INTRAVENOUS
  Filled 2018-04-27: qty 200

## 2018-04-27 MED ORDER — ACETAMINOPHEN 500 MG PO TABS
500.0000 mg | ORAL_TABLET | Freq: Four times a day (QID) | ORAL | Status: AC
  Administered 2018-04-27 – 2018-04-28 (×2): 500 mg via ORAL
  Filled 2018-04-27 (×4): qty 1

## 2018-04-27 MED ORDER — METOCLOPRAMIDE HCL 5 MG/ML IJ SOLN: 5.0000 mg | Freq: Three times a day (TID) | INTRAMUSCULAR | Status: DC | PRN

## 2018-04-27 MED ORDER — DIPHENHYDRAMINE HCL 12.5 MG/5ML PO ELIX: 12.5000 mg | ORAL_SOLUTION | ORAL | Status: DC | PRN

## 2018-04-27 MED ORDER — OXYCODONE HCL 5 MG/5ML PO SOLN
5.0000 mg | Freq: Once | ORAL | Status: DC | PRN
  Filled 2018-04-27: qty 5

## 2018-04-27 MED ORDER — DEXAMETHASONE SODIUM PHOSPHATE 10 MG/ML IJ SOLN
8.0000 mg | Freq: Once | INTRAMUSCULAR | Status: AC
  Administered 2018-04-27: 8 mg via INTRAVENOUS

## 2018-04-27 MED ORDER — CEFAZOLIN SODIUM-DEXTROSE 2-4 GM/100ML-% IV SOLN
2.0000 g | INTRAVENOUS | Status: AC
  Administered 2018-04-27: 2 g via INTRAVENOUS
  Filled 2018-04-27: qty 100

## 2018-04-27 MED ORDER — ACETAMINOPHEN 10 MG/ML IV SOLN
1000.0000 mg | Freq: Four times a day (QID) | INTRAVENOUS | Status: DC
  Administered 2018-04-27: 1000 mg via INTRAVENOUS
  Filled 2018-04-27: qty 100

## 2018-04-27 MED ORDER — METOCLOPRAMIDE HCL 5 MG PO TABS: 5.0000 mg | ORAL_TABLET | Freq: Three times a day (TID) | ORAL | Status: DC | PRN

## 2018-04-27 MED ORDER — HYDROCODONE-ACETAMINOPHEN 5-325 MG PO TABS
1.0000 | ORAL_TABLET | ORAL | Status: DC | PRN
  Administered 2018-04-27: 1 via ORAL
  Administered 2018-04-28 – 2018-04-29 (×3): 2 via ORAL
  Filled 2018-04-27: qty 2
  Filled 2018-04-27: qty 1
  Filled 2018-04-27 (×2): qty 2

## 2018-04-27 MED ORDER — TRANEXAMIC ACID-NACL 1000-0.7 MG/100ML-% IV SOLN
1000.0000 mg | INTRAVENOUS | Status: AC
  Administered 2018-04-27: 1000 mg via INTRAVENOUS
  Filled 2018-04-27: qty 100

## 2018-04-27 MED ORDER — ONDANSETRON HCL 4 MG/2ML IJ SOLN
INTRAMUSCULAR | Status: AC
  Filled 2018-04-27: qty 2

## 2018-04-27 MED ORDER — BISACODYL 10 MG RE SUPP: 10.0000 mg | Freq: Every day | RECTAL | Status: DC | PRN

## 2018-04-27 MED ORDER — LIP MEDEX EX OINT
TOPICAL_OINTMENT | CUTANEOUS | Status: DC | PRN
  Administered 2018-04-27: 22:00:00 via TOPICAL
  Filled 2018-04-27: qty 7

## 2018-04-27 MED ORDER — SODIUM CHLORIDE 0.9 % IV SOLN
INTRAVENOUS | Status: DC | PRN
  Administered 2018-04-27: 25 ug/min via INTRAVENOUS

## 2018-04-27 MED ORDER — ONDANSETRON HCL 4 MG/2ML IJ SOLN
INTRAMUSCULAR | Status: DC | PRN
  Administered 2018-04-27: 4 mg via INTRAVENOUS

## 2018-04-27 MED ORDER — PROPOFOL 10 MG/ML IV BOLUS
INTRAVENOUS | Status: AC
  Filled 2018-04-27: qty 20

## 2018-04-27 MED ORDER — MENTHOL 3 MG MT LOZG: 1.0000 | LOZENGE | OROMUCOSAL | Status: DC | PRN

## 2018-04-27 MED ORDER — POLYETHYLENE GLYCOL 3350 17 G PO PACK: 17.0000 g | PACK | Freq: Every day | ORAL | Status: DC | PRN

## 2018-04-27 MED ORDER — CLONIDINE HCL 0.1 MG PO TABS
0.1000 mg | ORAL_TABLET | Freq: Two times a day (BID) | ORAL | Status: DC
  Administered 2018-04-27 – 2018-04-29 (×4): 0.1 mg via ORAL
  Filled 2018-04-27 (×4): qty 1

## 2018-04-27 MED ORDER — ACETAMINOPHEN 325 MG PO TABS: 325.0000 mg | ORAL_TABLET | Freq: Four times a day (QID) | ORAL | Status: DC | PRN

## 2018-04-27 MED ORDER — SODIUM CHLORIDE 0.9 % IV SOLN
INTRAVENOUS | Status: DC
  Administered 2018-04-27 – 2018-04-28 (×2): via INTRAVENOUS

## 2018-04-27 MED ORDER — BUPIVACAINE-EPINEPHRINE (PF) 0.25% -1:200000 IJ SOLN
INTRAMUSCULAR | Status: DC | PRN
  Administered 2018-04-27: 30 mL

## 2018-04-27 MED ORDER — PHENOL 1.4 % MT LIQD
1.0000 | OROMUCOSAL | Status: DC | PRN
  Filled 2018-04-27: qty 177

## 2018-04-27 MED ORDER — ONDANSETRON HCL 4 MG/2ML IJ SOLN: 4.0000 mg | Freq: Four times a day (QID) | INTRAMUSCULAR | Status: DC | PRN

## 2018-04-27 SURGICAL SUPPLY — 38 items
BAG DECANTER FOR FLEXI CONT (MISCELLANEOUS) ×3 IMPLANT
BAG ZIPLOCK 12X15 (MISCELLANEOUS) IMPLANT
BLADE SAG 18X100X1.27 (BLADE) ×3 IMPLANT
CLOSURE WOUND 1/2 X4 (GAUZE/BANDAGES/DRESSINGS) ×1
COVER PERINEAL POST (MISCELLANEOUS) ×3 IMPLANT
COVER SURGICAL LIGHT HANDLE (MISCELLANEOUS) ×6 IMPLANT
COVER WAND RF STERILE (DRAPES) ×3 IMPLANT
CUP ACET PINNACLE SECTR 50MM (Hips) ×1 IMPLANT
DRAPE STERI IOBAN 125X83 (DRAPES) ×3 IMPLANT
DRAPE U-SHAPE 47X51 STRL (DRAPES) ×6 IMPLANT
DRSG ADAPTIC 3X8 NADH LF (GAUZE/BANDAGES/DRESSINGS) ×3 IMPLANT
DRSG MEPILEX BORDER 4X4 (GAUZE/BANDAGES/DRESSINGS) ×3 IMPLANT
DRSG MEPILEX BORDER 4X8 (GAUZE/BANDAGES/DRESSINGS) ×3 IMPLANT
DURAPREP 26ML APPLICATOR (WOUND CARE) ×3 IMPLANT
ELECT REM PT RETURN 15FT ADLT (MISCELLANEOUS) ×3 IMPLANT
EVACUATOR 1/8 PVC DRAIN (DRAIN) ×3 IMPLANT
FEM STEM 12/14 TAPER SZ 4 HIP (Orthopedic Implant) ×3 IMPLANT
FEMORAL STEM 12/14 TPR SZ4 HIP (Orthopedic Implant) ×1 IMPLANT
GLOVE BIO SURGEON STRL SZ8 (GLOVE) ×3 IMPLANT
GLOVE BIOGEL PI IND STRL 8 (GLOVE) ×1 IMPLANT
GLOVE BIOGEL PI INDICATOR 8 (GLOVE) ×2
GOWN STRL REUS W/TWL LRG LVL3 (GOWN DISPOSABLE) ×3 IMPLANT
GOWN STRL REUS W/TWL XL LVL3 (GOWN DISPOSABLE) ×3 IMPLANT
HEAD FEM STD 32X+1 STRL (Hips) ×3 IMPLANT
HOLDER FOLEY CATH W/STRAP (MISCELLANEOUS) ×3 IMPLANT
LINER MARATHON 32 50 (Hips) ×3 IMPLANT
MANIFOLD NEPTUNE II (INSTRUMENTS) ×3 IMPLANT
PACK ANTERIOR HIP CUSTOM (KITS) ×3 IMPLANT
PINNACLE SECTOR CUP 50MM (Hips) ×3 IMPLANT
STRIP CLOSURE SKIN 1/2X4 (GAUZE/BANDAGES/DRESSINGS) ×2 IMPLANT
SUT ETHIBOND NAB CT1 #1 30IN (SUTURE) ×3 IMPLANT
SUT MNCRL AB 4-0 PS2 18 (SUTURE) ×3 IMPLANT
SUT STRATAFIX 0 PDS 27 VIOLET (SUTURE) ×3
SUT VIC AB 2-0 CT1 27 (SUTURE) ×4
SUT VIC AB 2-0 CT1 TAPERPNT 27 (SUTURE) ×2 IMPLANT
SUTURE STRATFX 0 PDS 27 VIOLET (SUTURE) ×1 IMPLANT
TRAY FOLEY MTR SLVR 16FR STAT (SET/KITS/TRAYS/PACK) ×3 IMPLANT
YANKAUER SUCT BULB TIP 10FT TU (MISCELLANEOUS) ×3 IMPLANT
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
## 2018-04-27 NOTE — Anesthesia Procedure Notes (Signed)
Spinal  Patient location during procedure: OR Staffing Anesthesiologist: Jadalee Westcott E, MD Performed: anesthesiologist  Preanesthetic Checklist Completed: patient identified, surgical consent, pre-op evaluation, timeout performed, IV checked, risks and benefits discussed and monitors and equipment checked Spinal Block Patient position: sitting Prep: site prepped and draped and DuraPrep Patient monitoring: continuous pulse ox, blood pressure and heart rate Approach: midline Location: L3-4 Injection technique: single-shot Needle Needle type: Whitacre  Needle gauge: 22 G Needle length: 9 cm Additional Notes Functioning IV was confirmed and monitors were applied. Sterile prep and drape, including hand hygiene and sterile gloves were used. The patient was positioned and the spine was prepped. The skin was anesthetized with lidocaine.  Free flow of clear CSF was obtained prior to injecting local anesthetic into the CSF. The needle was carefully withdrawn. The patient tolerated the procedure well.      

## 2018-04-27 NOTE — Op Note (Signed)
OPERATIVE REPORT- TOTAL HIP ARTHROPLASTY   PREOPERATIVE DIAGNOSIS: Osteoarthritis of the Right hip.   POSTOPERATIVE DIAGNOSIS: Osteoarthritis of the Right  hip.   PROCEDURE: Right total hip arthroplasty, anterior approach.   SURGEON: Ollen GrossFrank My Rinke, MD   ASSISTANT: Dimitri PedAmber Constable, PA-C  ANESTHESIA:  Spinal  ESTIMATED BLOOD LOSS:-250 mL    DRAINS: Hemovac x1.   COMPLICATIONS: None   CONDITION: PACU - hemodynamically stable.   BRIEF CLINICAL NOTE: Bruce Gates is a 72 y.o. male who has advanced end-  stage arthritis of their Right  hip with progressively worsening pain and  dysfunction.The patient has failed nonoperative management and presents for  total hip arthroplasty.   PROCEDURE IN DETAIL: After successful administration of spinal  anesthetic, the traction boots for the [facility] bed were placed on both  feet and the patient was [facility] the [facility] bed, boots placed into the leg  holders. The Right hip was then isolated from the perineum with plastic  drapes and prepped and draped in the usual sterile fashion. ASIS and  greater trochanter were marked and a oblique incision was made, starting  at about 1 cm lateral and 2 cm distal to the ASIS and coursing towards  the anterior cortex of the femur. The skin was cut with a 10 blade  through subcutaneous tissue to the level of the fascia overlying the  tensor fascia lata muscle. The fascia was then incised in line with the  incision at the junction of the anterior third and posterior 2/3rd. The  muscle was teased off the fascia and then the interval between the TFL  and the rectus was developed. The Hohmann retractor was then placed at  the top of the femoral neck over the capsule. The vessels overlying the  capsule were cauterized and the fat on top of the capsule was removed.  A Hohmann retractor was then placed anterior underneath the rectus  femoris to give exposure to the entire anterior capsule. A T-shaped   capsulotomy was performed. The edges were tagged and the femoral head  was identified.       Osteophytes are removed off the superior acetabulum.  The femoral neck was then cut in situ with an oscillating saw. Traction  was then applied to the left lower extremity utilizing the [facility]  traction. The femoral head was then removed. Retractors were placed  around the acetabulum and then circumferential removal of the labrum was  performed. Osteophytes were also removed. Reaming starts at 47 mm to  medialize and  Increased in 2 mm increments to 49 mm. We reamed in  approximately 40 degrees of abduction, 20 degrees anteversion. A 50 mm  pinnacle acetabular shell was then impacted in anatomic position under  fluoroscopic guidance with excellent purchase. We did not need to place  any additional dome screws. A 32 mm neutral + 4 marathon liner was then  placed into the acetabular shell.       The femoral lift was then placed along the lateral aspect of the femur  just distal to the vastus ridge. The leg was  externally rotated and capsule  was stripped off the inferior aspect of the femoral neck down to the  level of the lesser trochanter, this was done with electrocautery. The femur was lifted after this was performed. The  leg was then placed in an extended and adducted position essentially delivering the femur. We also removed the capsule superiorly and the piriformis from the piriformis  fossa to gain excellent exposure of the  proximal femur. Rongeur was used to remove some cancellous bone to get  into the lateral portion of the proximal femur for placement of the  initial starter reamer. The starter broaches was placed  the starter broach  and was shown to go down the center of the canal. Broaching  with the Actis system was then performed starting at size 0  coursing  Up to size 5. A size 5 had excellent torsional and rotational  and axial stability. The trial high offset neck was then placed   with a 32 + 1 trial head. The hip was then reduced. We confirmed that  the stem was in the canal both on AP and lateral x-rays. It also has excellent sizing. The hip was reduced with outstanding stability through full extension and full external rotation.. AP pelvis was taken and the leg lengths were measured and found to be equal. Hip was then dislocated again and the femoral head and neck removed. The  femoral broach was removed. Size 5 Actis stem with a high offset  neck was then impacted into the femur following native anteversion. Has  excellent purchase in the canal. Excellent torsional and rotational and  axial stability. It is confirmed to be in the canal on AP and lateral  fluoroscopic views. The 32 + 1 metal head was placed and the hip  reduced with outstanding stability. Again AP pelvis was taken and it  confirmed that the leg lengths were equal. The wound was then copiously  irrigated with saline solution and the capsule reattached and repaired  with Ethibond suture. 30 ml of .25% Bupivicaine was  injected into the capsule and into the edge of the tensor fascia lata as well as subcutaneous tissue. The fascia overlying the tensor fascia lata was then closed with a running #1 V-Loc. Subcu was closed with interrupted 2-0 Vicryl and subcuticular running 4-0 Monocryl. Incision was cleaned  and dried. Steri-Strips and a bulky sterile dressing applied. Hemovac  drain was hooked to suction and then the patient was awakened and transported to  recovery in stable condition.        Please note that a surgical assistant was a medical necessity for this procedure to perform it in a safe and expeditious manner. Assistant was necessary to provide appropriate retraction of vital neurovascular structures and to prevent femoral fracture and allow for anatomic placement of the prosthesis.  Gaynelle Arabian, M.D.

## 2018-04-27 NOTE — Care Plan (Signed)
Ortho Bundle Case Management Note  Patient Details  Name: Bruce Gates MRN: [id number] Date of Birth: 04/10/1946  R THA on 04-27-18 DCP:  Home with assist from daughter.  Lives alone in a mobile home with 2 ste. PT: HEP                 DME Arranged:  3-N-1 DME Agency:  [employer]  HH Arranged:  NA HH Agency:  NA  Additional Comments: Please contact me with any questions of if this plan should need to change.  Ennis FortsJill A Lauer, RN,CCM [facility]  [phone] 04/27/2018, 12:44 PM

## 2018-04-27 NOTE — Anesthesia Postprocedure Evaluation (Signed)
Anesthesia Post Note  Patient: Bruce Gates  Procedure(s) Performed: RIGHT TOTAL HIP ARTHROPLASTY ANTERIOR APPROACH (Right Hip)     Patient location during evaluation: PACU Anesthesia Type: Spinal Level of consciousness: oriented and awake and alert Pain management: pain level controlled Vital Signs Assessment: post-procedure vital signs reviewed and stable Respiratory status: spontaneous breathing, respiratory function stable and patient connected to nasal cannula oxygen Cardiovascular status: blood pressure returned to baseline and stable Postop Assessment: no headache, no backache, no apparent nausea or vomiting and spinal receding Anesthetic complications: no    Last Vitals:  Vitals:   04/27/18 1730 04/27/18 1745  BP: 133/74 125/79  Pulse: (!) 50 (!) 55  Resp: 16 17  Temp:    SpO2: 100% 100%    Last Pain:  Vitals:   04/27/18 1745  TempSrc:   PainSc: 0-No pain                 Lucretia Kernarolyn E Kysa Calais

## 2018-04-27 NOTE — Transfer of Care (Signed)
Immediate Anesthesia Transfer of Care Note  Patient: Bruce Gates  Procedure(s) Performed: RIGHT TOTAL HIP ARTHROPLASTY ANTERIOR APPROACH (Right Hip)  Patient Location: PACU  Anesthesia Type:MAC and Spinal  Level of Consciousness: awake, alert  and oriented  Airway & Oxygen Therapy: Patient Spontanous Breathing and Patient connected to face mask oxygen  Post-op Assessment: Report given to RN and Post -op Vital signs reviewed and stable  Post vital signs: Reviewed and stable  Last Vitals:  Vitals Value Taken Time  BP    Temp    Pulse 56 04/27/2018  4:40 PM  Resp 14 04/27/2018  4:40 PM  SpO2 100 % 04/27/2018  4:40 PM  Vitals shown include unvalidated device data.  Last Pain:  Vitals:   04/27/18 1236  TempSrc: Oral  PainSc: 0-No pain         Complications: No apparent anesthesia complications
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
## 2018-04-27 NOTE — Interval H&P Note (Signed)
History and Physical Interval Note:  04/27/2018 12:57 PM  Bruce Gates  has presented today for surgery, with the diagnosis of right hip osteoarthritis  The various methods of treatment have been discussed with the patient and family. After consideration of risks, benefits and other options for treatment, the patient has consented to  Procedure(s) with comments: RIGHT TOTAL HIP ARTHROPLASTY ANTERIOR APPROACH (Right) - 100min as a surgical intervention .  The patient's history has been reviewed, patient examined, no change in status, stable for surgery.  I have reviewed the patient's chart and labs.  Questions were answered to the patient's satisfaction.     [name]

## 2018-04-27 NOTE — Anesthesia Procedure Notes (Signed)
Date/Time: 04/27/2018 2:52 PM Performed by: Thornell MuleStubblefield, Estefan Pattison G, CRNA Oxygen Delivery Method: Simple face mask

## 2018-04-28 ENCOUNTER — Encounter (HOSPITAL_COMMUNITY): Payer: Self-pay | Admitting: Orthopedic Surgery

## 2018-04-28 LAB — BASIC METABOLIC PANEL
Anion gap: 9 (ref 5–15)
BUN: 14 mg/dL (ref 8–23)
CALCIUM: 9.1 mg/dL (ref 8.9–10.3)
CO2: 26 mmol/L (ref 22–32)
CREATININE: 0.89 mg/dL (ref 0.61–1.24)
Chloride: 105 mmol/L (ref 98–111)
GFR calc Af Amer: >60 mL/min (ref >60)
GLUCOSE: 196 mg/dL — AB (ref 70–99)
Potassium: 4 mmol/L (ref 3.5–5.1)
Sodium: 140 mmol/L (ref 135–145)

## 2018-04-28 LAB — CBC
HEMATOCRIT: 35.1 % — AB (ref 39.0–52.0)
Hemoglobin: 11.2 g/dL — ABNORMAL LOW (ref 13.0–17.0)
MCH: 29.3 pg (ref 26.0–34.0)
MCHC: 31.9 g/dL (ref 30.0–36.0)
MCV: 91.9 fL (ref 80.0–100.0)
PLATELETS: 134 10*3/uL — AB (ref 150–400)
RBC: 3.82 MIL/uL — ABNORMAL LOW (ref 4.22–5.81)
RDW: 16.9 % — AB (ref 11.5–15.5)
WBC: 12.1 10*3/uL — AB (ref 4.0–10.5)
nRBC: 0 % (ref 0.0–0.2)

## 2018-04-28 MED ORDER — HYDRALAZINE HCL 20 MG/ML IJ SOLN: 10.0000 mg | Freq: Four times a day (QID) | INTRAMUSCULAR | Status: DC | PRN

## 2018-04-28 MED ORDER — METHOCARBAMOL 500 MG PO TABS: 500.0000 mg | ORAL_TABLET | Freq: Four times a day (QID) | ORAL | 0 refills | Status: DC | PRN

## 2018-04-28 MED ORDER — HYDROCODONE-ACETAMINOPHEN 5-325 MG PO TABS: 1.0000 | ORAL_TABLET | Freq: Four times a day (QID) | ORAL | 0 refills | Status: DC | PRN

## 2018-04-28 MED ORDER — ASPIRIN 325 MG PO TBEC: 325.0000 mg | DELAYED_RELEASE_TABLET | Freq: Two times a day (BID) | ORAL | 0 refills | Status: AC

## 2018-04-28 MED ORDER — ASPIRIN 325 MG PO TBEC: 325.0000 mg | DELAYED_RELEASE_TABLET | Freq: Two times a day (BID) | ORAL | 0 refills | Status: DC

## 2018-04-28 NOTE — Plan of Care (Signed)
  Problem: Health Behavior/Discharge Planning: Goal: Ability to manage health-related needs will improve Outcome: Progressing   Problem: Clinical Measurements: Goal: Ability to maintain clinical measurements within normal limits will improve Outcome: Progressing   Problem: Clinical Measurements: Goal: Will remain free from infection Outcome: Progressing   Problem: Health Behavior/Discharge Planning: Goal: Ability to manage health-related needs will improve Outcome: Progressing   Problem: Clinical Measurements: Goal: Ability to maintain clinical measurements within normal limits will improve Outcome: Progressing Goal: Will remain free from infection Outcome: Progressing Goal: Diagnostic test results will improve Outcome: Progressing Goal: Respiratory complications will improve Outcome: Progressing   Problem: Activity: Goal: Risk for activity intolerance will decrease Outcome: Progressing   Problem: Nutrition: Goal: Adequate nutrition will be maintained Outcome: Progressing   Problem: Coping: Goal: Level of anxiety will decrease Outcome: Progressing
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
## 2018-04-28 NOTE — Discharge Instructions (Signed)
°Dr. Frank Aluisio °Total Joint Specialist °[facility] °[address] °[place], [place] [address] °[phone] ° °ANTERIOR APPROACH TOTAL HIP REPLACEMENT POSTOPERATIVE DIRECTIONS ° ° °Hip Rehabilitation, Guidelines Following Surgery  °The results of a hip operation are greatly improved after range of motion and muscle strengthening exercises. Follow all safety measures which are given to protect your hip. If any of these exercises cause increased pain or swelling in your joint, decrease the amount until you are comfortable again. Then slowly increase the exercises. Call your caregiver if you have problems or questions.  ° °HOME CARE INSTRUCTIONS  °• Remove items at home which could result in a fall. This includes throw rugs or furniture in walking pathways.  °· ICE to the affected hip every three hours for 30 minutes at a time and then as needed for pain and swelling.  Continue to use ice on the hip for pain and swelling from surgery. You may notice swelling that will progress down to the foot and ankle.  This is normal after surgery.  Elevate the leg when you are not up walking on it.   °· Continue to use the breathing machine which will help keep your temperature down.  It is common for your temperature to cycle up and down following surgery, especially at night when you are not up moving around and exerting yourself.  The breathing machine keeps your lungs expanded and your temperature down. ° °DIET °You may resume your previous home diet once your are discharged from the hospital. ° °DRESSING / WOUND CARE / SHOWERING °You may shower 3 days after surgery, but keep the wounds dry during showering.  You may use an occlusive plastic wrap (Press'n Seal for example), NO SOAKING/SUBMERGING IN THE BATHTUB.  If the bandage gets wet, change with a clean dry gauze.  If the incision gets wet, pat the wound dry with a clean towel. °You may start showering once you are discharged home but do not submerge the  incision under water. Just pat the incision dry and apply a dry gauze dressing on daily. °Change the surgical dressing daily and reapply a dry dressing each time. ° °ACTIVITY °Walk with your walker as instructed. °Use walker as long as suggested by your caregivers. °Avoid periods of inactivity such as sitting longer than an hour when not asleep. This helps prevent blood clots.  °You may resume a sexual relationship in one month or when given the OK by your doctor.  °You may return to work once you are cleared by your doctor.  °Do not drive a car for 6 weeks or until released by you surgeon.  °Do not drive while taking narcotics. ° °WEIGHT BEARING °Weight bearing as tolerated with assist device (walker, cane, etc) as directed, use it as long as suggested by your surgeon or therapist, typically at least 4-6 weeks. ° °POSTOPERATIVE CONSTIPATION PROTOCOL °Constipation - defined medically as fewer than three stools per week and severe constipation as less than one stool per week. ° °One of the most common issues patients have following surgery is constipation.  Even if you have a regular bowel pattern at home, your normal regimen is likely to be disrupted due to multiple reasons following surgery.  Combination of anesthesia, postoperative narcotics, change in appetite and fluid intake all can affect your bowels.  In order to avoid complications following surgery, here are some recommendations in order to help you during your recovery period. ° °Colace (docusate) - Pick up an over-the-counter form   of Colace or another stool softener and take twice a day as long as you are requiring postoperative pain medications.  Take with a full glass of water daily.  If you experience loose stools or diarrhea, hold the colace until you stool forms back up.  If your symptoms do not get better within 1 week or if they get worse, check with your doctor. ° °Dulcolax (bisacodyl) - Pick up over-the-counter and take as directed by the product  packaging as needed to assist with the movement of your bowels.  Take with a full glass of water.  Use this product as needed if not relieved by Colace only.  ° °MiraLax (polyethylene glycol) - Pick up over-the-counter to have on hand.  MiraLax is a solution that will increase the amount of water in your bowels to assist with bowel movements.  Take as directed and can mix with a glass of water, juice, soda, coffee, or tea.  Take if you go more than two days without a movement. °Do not use MiraLax more than once per day. Call your doctor if you are still constipated or irregular after using this medication for 7 days in a row. ° °If you continue to have problems with postoperative constipation, please contact the office for further assistance and recommendations.  If you experience "the worst abdominal pain ever" or develop nausea or vomiting, please contact the office immediatly for further recommendations for treatment. ° °ITCHING ° If you experience itching with your medications, try taking only a single pain pill, or even half a pain pill at a time.  You can also use Benadryl over the counter for itching or also to help with sleep.  ° °TED HOSE STOCKINGS °Wear the elastic stockings on both legs for three weeks following surgery during the day but you may remove then at night for sleeping. ° °MEDICATIONS °See your medication summary on the “After Visit Summary” that the nursing staff will review with you prior to discharge.  You may have some home medications which will be placed on hold until you complete the course of blood thinner medication.  It is important for you to complete the blood thinner medication as prescribed by your surgeon.  Continue your approved medications as instructed at time of discharge. ° °PRECAUTIONS °If you experience chest pain or shortness of breath - call 911 immediately for transfer to the hospital emergency department.  °If you develop a fever greater that 101 F, purulent drainage  from wound, increased redness or drainage from wound, foul odor from the wound/dressing, or calf pain - CONTACT YOUR SURGEON.   °                                                °FOLLOW-UP APPOINTMENTS °Make sure you keep all of your appointments after your operation with your surgeon and caregivers. You should call the office at the above phone number and make an appointment for approximately two weeks after the date of your surgery or on the date instructed by your surgeon outlined in the "After Visit Summary". ° °RANGE OF MOTION AND STRENGTHENING EXERCISES  °These exercises are designed to help you keep full movement of your hip joint. Follow your caregiver's or physical therapist's instructions. Perform all exercises about fifteen times, three times per day or as directed. Exercise both hips, even if you have   had only one joint replacement. These exercises can be done on a training (exercise) mat, on the floor, on a table or on a bed. Use whatever works the best and is most comfortable for you. Use music or television while you are exercising so that the exercises are a pleasant break in your day. This will make your life better with the exercises acting as a break in routine you can look forward to.  °• Lying on your back, slowly slide your foot toward your buttocks, raising your knee up off the floor. Then slowly slide your foot back down until your leg is straight again.  °• Lying on your back spread your legs as far apart as you can without causing discomfort.  °• Lying on your side, raise your upper leg and foot straight up from the floor as far as is comfortable. Slowly lower the leg and repeat.  °• Lying on your back, tighten up the muscle in the front of your thigh (quadriceps muscles). You can do this by keeping your leg straight and trying to raise your heel off the floor. This helps strengthen the largest muscle supporting your knee.  °• Lying on your back, tighten up the muscles of your buttocks both  with the legs straight and with the knee bent at a comfortable angle while keeping your heel on the floor.  ° °IF YOU ARE TRANSFERRED TO A SKILLED REHAB FACILITY °If the patient is transferred to a skilled rehab facility following release from the hospital, a list of the current medications will be sent to the facility for the patient to continue.  When discharged from the skilled rehab facility, please have the facility set up the patient's Home Health Physical Therapy prior to being released. Also, the skilled facility will be responsible for providing the patient with their medications at time of release from the facility to include their pain medication, the muscle relaxants, and their blood thinner medication. If the patient is still at the rehab facility at time of the two week follow up appointment, the skilled rehab facility will also need to assist the patient in arranging follow up appointment in our office and any transportation needs. ° °MAKE SURE YOU:  °• Understand these instructions.  °• Get help right away if you are not doing well or get worse.  ° ° °Pick up stool softner and laxative for home use following surgery while on pain medications. °Do not submerge incision under water. °Please use good hand washing techniques while changing dressing each day. °May shower starting three days after surgery. °Please use a clean towel to pat the incision dry following showers. °Continue to use ice for pain and swelling after surgery. °Do not use any lotions or creams on the incision until instructed by your surgeon. ° °

## 2018-04-29 LAB — BASIC METABOLIC PANEL
Anion gap: 8 (ref 5–15)
BUN: 19 mg/dL (ref 8–23)
CHLORIDE: 107 mmol/L (ref 98–111)
CO2: 27 mmol/L (ref 22–32)
CREATININE: 0.74 mg/dL (ref 0.61–1.24)
Calcium: 9.5 mg/dL (ref 8.9–10.3)
GFR calc non Af Amer: >60 mL/min (ref >60)
Glucose, Bld: 146 mg/dL — ABNORMAL HIGH (ref 70–99)
POTASSIUM: 3.9 mmol/L (ref 3.5–5.1)
Sodium: 142 mmol/L (ref 135–145)

## 2018-04-29 LAB — CBC
HEMATOCRIT: 34.6 % — AB (ref 39.0–52.0)
Hemoglobin: 11 g/dL — ABNORMAL LOW (ref 13.0–17.0)
MCH: 28.8 pg (ref 26.0–34.0)
MCHC: 31.8 g/dL (ref 30.0–36.0)
MCV: 90.6 fL (ref 80.0–100.0)
Platelets: 155 10*3/uL (ref 150–400)
RBC: 3.82 MIL/uL — ABNORMAL LOW (ref 4.22–5.81)
RDW: 17.1 % — ABNORMAL HIGH (ref 11.5–15.5)
WBC: 13.5 10*3/uL — AB (ref 4.0–10.5)
nRBC: 0 % (ref 0.0–0.2)
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
## 2018-04-29 NOTE — Plan of Care (Signed)
  Problem: Health Behavior/Discharge Planning: Goal: Ability to manage health-related needs will improve Outcome: Progressing   Problem: Clinical Measurements: Goal: Ability to maintain clinical measurements within normal limits will improve Outcome: Progressing Goal: Will remain free from infection Outcome: Progressing Goal: Diagnostic test results will improve Outcome: Progressing Goal: Respiratory complications will improve Outcome: Progressing Goal: Cardiovascular complication will be avoided Outcome: Progressing   Problem: Activity: Goal: Risk for activity intolerance will decrease Outcome: Progressing   Problem: Nutrition: Goal: Adequate nutrition will be maintained Outcome: Progressing   Problem: Coping: Goal: Level of anxiety will decrease Outcome: Progressing   Problem: Elimination: Goal: Will not experience complications related to bowel motility Outcome: Progressing Goal: Will not experience complications related to urinary retention Outcome: Progressing   Problem: Pain Managment: Goal: General experience of comfort will improve Outcome: Progressing   Problem: Safety: Goal: Ability to remain free from injury will improve Outcome: Progressing   Problem: Skin Integrity: Goal: Risk for impaired skin integrity will decrease Outcome: Progressing   Problem: Education: Goal: Knowledge of the prescribed therapeutic regimen will improve Outcome: Progressing Goal: Understanding of discharge needs will improve Outcome: Progressing Goal: Individualized Educational Video(s) Outcome: Progressing   Problem: Activity: Goal: Ability to avoid complications of mobility impairment will improve Outcome: Progressing Goal: Ability to tolerate increased activity will improve Outcome: Progressing   Problem: Clinical Measurements: Goal: Postoperative complications will be avoided or minimized Outcome: Progressing   Problem: Pain Management: Goal: Pain level will  decrease with appropriate interventions Outcome: Progressing   Problem: Skin Integrity: Goal: Will show signs of wound healing Outcome: Progressing   
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
## 2018-05-01 NOTE — Progress Notes (Signed)
Selena BattenKim, from [facility] requesting to speak with discharge planner about this patient that was discharged 11/27  [facility] come see her dad.   Patient was set up with OPPT with Dr. Deri FuellingAluisio's office. Dr. [place] PinkAluisio/Kristie on call for [facility]. Called down to OR to clear this with Dr. Lequita HaltAluisio before preceeding.   Selena BattenKim, RN from [facility] requesting information. Will call CM Suzannne Peele about helping arrange this HH if Dr.approves.
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
## 2019-11-20 IMAGING — DX DG PORTABLE PELVIS
2 series · 2 of 2 positions shown · non-contrast
Comparison: CT of the abdomen and pelvis 12/07/2017

CLINICAL DATA: Total hip replacement.

EXAM:
PORTABLE PELVIS 1-2 VIEWS

[pelvis ap (1 of 2)]
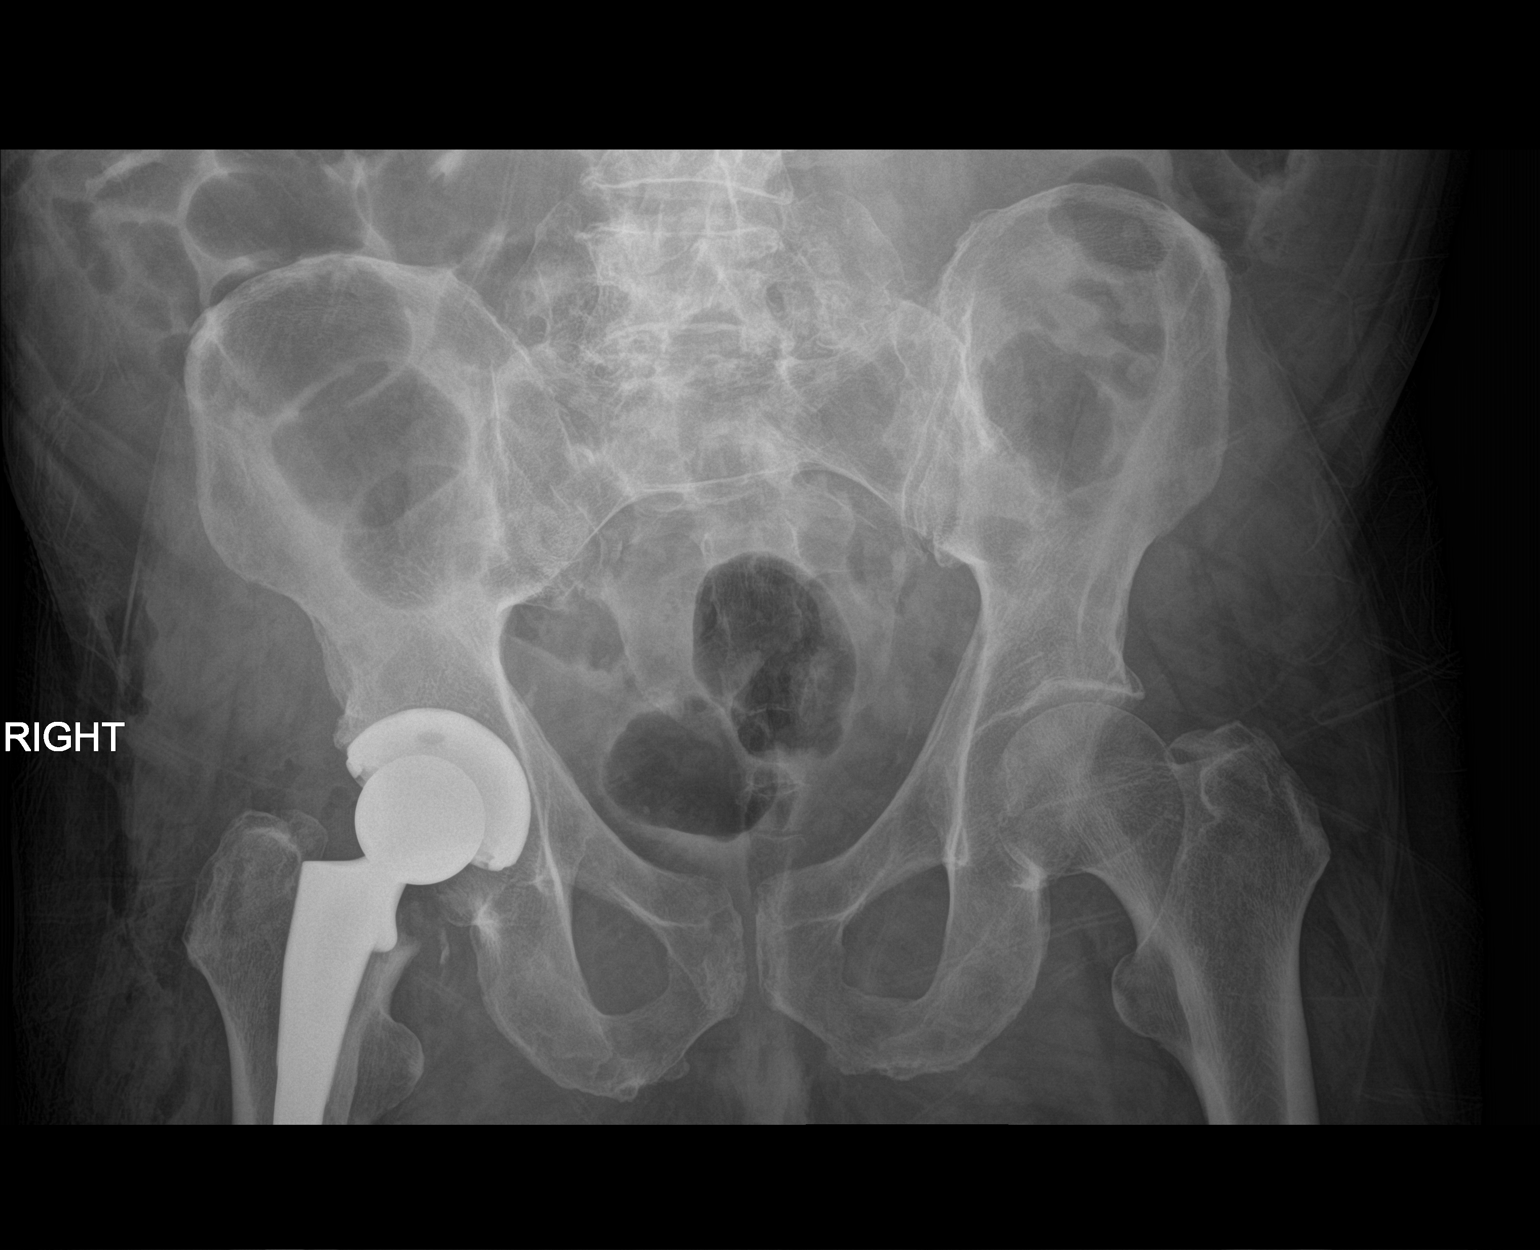

[pelvis ap (2 of 2)]
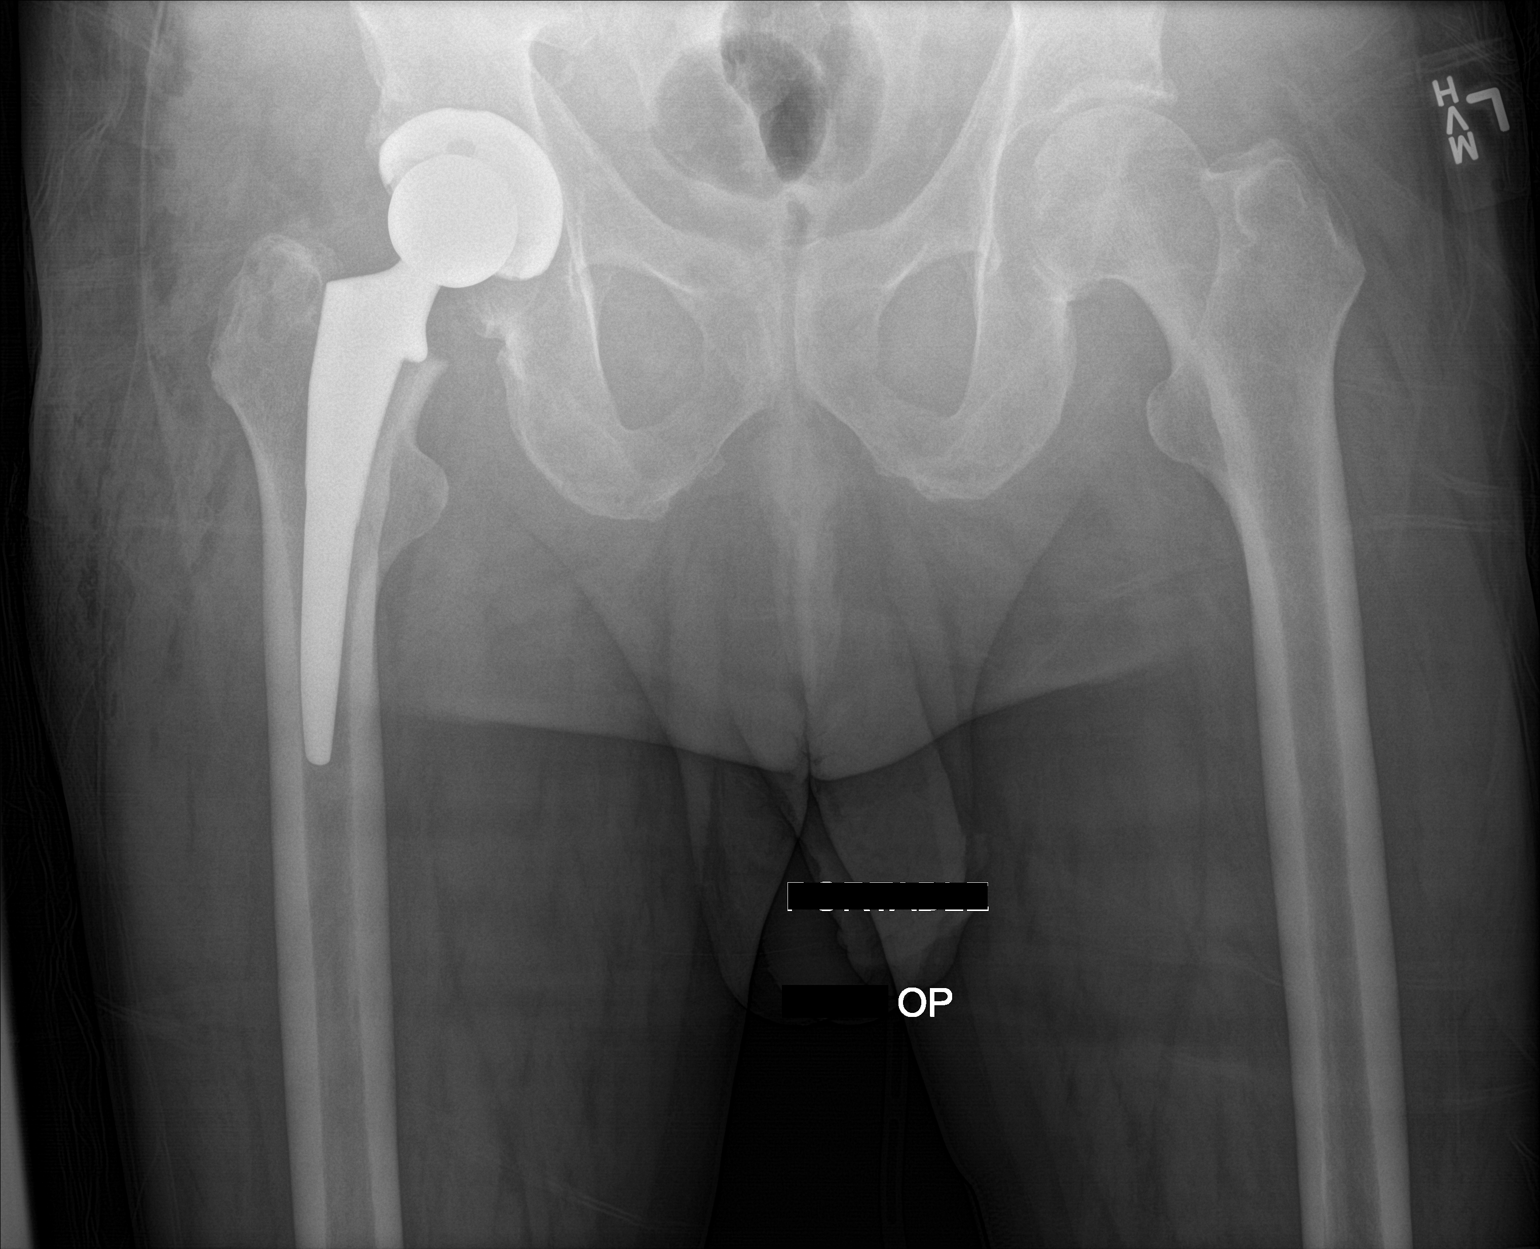

[2 of 2 positions shown; findings below may reference images not displayed]

FINDINGS: AP views demonstrate right total hip arthroplasty. Gas and fluid are
present in the joint, as expected. Visualized pelvis is normal.
Visualized femur is normal.
IMPRESSION: 1. Right total hip arthroplasty without radiographic evidence for
complication.

## 2020-10-14 DIAGNOSIS — I517 Cardiomegaly: Secondary | ICD-10-CM | POA: Diagnosis not present

## 2020-10-14 DIAGNOSIS — I358 Other nonrheumatic aortic valve disorders: Secondary | ICD-10-CM | POA: Diagnosis not present

## 2020-10-14 DIAGNOSIS — I639 Cerebral infarction, unspecified: Secondary | ICD-10-CM

## 2020-12-14 ENCOUNTER — Emergency Department (HOSPITAL_COMMUNITY)
Admission: EM | Admit: 2020-12-14 | Discharge: 2020-12-15 | Disposition: A | Discharge status: Home / Self Care | Payer: Medicare HMO | Attending: Emergency Medicine | Admitting: Emergency Medicine

## 2020-12-14 ENCOUNTER — Emergency Department (HOSPITAL_COMMUNITY): Payer: Medicare HMO

## 2020-12-14 DIAGNOSIS — R519 Headache, unspecified: Secondary | ICD-10-CM | POA: Insufficient documentation

## 2020-12-14 DIAGNOSIS — Z87891 Personal history of nicotine dependence: Secondary | ICD-10-CM | POA: Diagnosis not present

## 2020-12-14 DIAGNOSIS — Z96641 Presence of right artificial hip joint: Secondary | ICD-10-CM | POA: Insufficient documentation

## 2020-12-14 DIAGNOSIS — I10 Essential (primary) hypertension: Secondary | ICD-10-CM | POA: Diagnosis present

## 2020-12-14 DIAGNOSIS — Z79899 Other long term (current) drug therapy: Secondary | ICD-10-CM | POA: Insufficient documentation

## 2020-12-14 LAB — BASIC METABOLIC PANEL
Anion gap: 9 (ref 5–15)
BUN: 12 mg/dL (ref 8–23)
CO2: 27 mmol/L (ref 22–32)
Calcium: 9.5 mg/dL (ref 8.9–10.3)
Chloride: 99 mmol/L (ref 98–111)
Creatinine, Ser: 0.92 mg/dL (ref 0.61–1.24)
GFR, Estimated: >60 mL/min (ref >60)
Glucose, Bld: 110 mg/dL — ABNORMAL HIGH (ref 70–99)
Potassium: 3.8 mmol/L (ref 3.5–5.1)
Sodium: 135 mmol/L (ref 135–145)

## 2020-12-14 LAB — CBC WITH DIFFERENTIAL/PLATELET
Abs Immature Granulocytes: 0.02 10*3/uL (ref 0.00–0.07)
Basophils Absolute: 0.1 10*3/uL (ref 0.0–0.1)
Basophils Relative: 1 %
Eosinophils Absolute: 0.2 10*3/uL (ref 0.0–0.5)
Eosinophils Relative: 4 %
HCT: 34.9 % — ABNORMAL LOW (ref 39.0–52.0)
Hemoglobin: 11.2 g/dL — ABNORMAL LOW (ref 13.0–17.0)
Immature Granulocytes: 0 %
Lymphocytes Relative: 18 %
Lymphs Abs: 1.1 10*3/uL (ref 0.7–4.0)
MCH: 28.6 pg (ref 26.0–34.0)
MCHC: 32.1 g/dL (ref 30.0–36.0)
MCV: 89.3 fL (ref 80.0–100.0)
Monocytes Absolute: 0.5 10*3/uL (ref 0.1–1.0)
Monocytes Relative: 8 %
Neutro Abs: 4.2 10*3/uL (ref 1.7–7.7)
Neutrophils Relative %: 69 %
Platelets: 175 10*3/uL (ref 150–400)
RBC: 3.91 MIL/uL — ABNORMAL LOW (ref 4.22–5.81)
RDW: 17.7 % — ABNORMAL HIGH (ref 11.5–15.5)
WBC: 6.1 10*3/uL (ref 4.0–10.5)
nRBC: 0 % (ref 0.0–0.2)

## 2020-12-14 LAB — TROPONIN I (HIGH SENSITIVITY)
Troponin I (High Sensitivity): 13 ng/L (ref <18)
Troponin I (High Sensitivity): 12 ng/L (ref <18)

## 2020-12-14 LAB — URINALYSIS, ROUTINE W REFLEX MICROSCOPIC
Bilirubin Urine: NEGATIVE
Glucose, UA: NEGATIVE mg/dL
Hgb urine dipstick: NEGATIVE
Ketones, ur: NEGATIVE mg/dL
Leukocytes,Ua: NEGATIVE
Nitrite: NEGATIVE
Protein, ur: NEGATIVE mg/dL
Specific Gravity, Urine: 1.013 (ref 1.005–1.030)
pH: 7 (ref 5.0–8.0)

## 2020-12-14 MED ORDER — CLONIDINE HCL 0.1 MG PO TABS
0.1000 mg | ORAL_TABLET | Freq: Once | ORAL | Status: AC
  Administered 2020-12-14: 0.1 mg via ORAL
  Filled 2020-12-14: qty 1

## 2020-12-14 NOTE — ED Provider Notes (Signed)
Emergency Medicine Provider Triage Evaluation Note  TILLMAN KAZMIERSKI , a 75 y.o. male  was evaluated in triage.  Pt complains of elevated blood pressure. Per EMS, daughter checked patient's BP which was elevated. Patient notes he has been compliant with his BP medications. He endorses bilateral blurry vision that started today. Denies speech changes, unilateral weakness, and dizziness. No chest pain or shortness of breath. Per EMS, patient has a history of TIA 6 months ago.  Review of Systems  Positive: Vision changes Negative: Chest pain  Physical Exam  BP (!) 212/140 (BP Location: Left Arm)   Pulse (!) 51   Temp 98.5 F (36.9 C) (Oral)   Resp 16   SpO2 98%  Gen:   Awake, no distress   Resp:  Normal effort  MSK:   Moves extremities without difficulty  Other:    Medical Decision Making  Medically screening exam initiated at 7:22 PM.  Appropriate orders placed.  LENY MOROZOV was informed that the remainder of the evaluation will be completed by another provider, this initial triage assessment does not replace that evaluation, and the importance of remaining in the ED until their evaluation is complete.  BP elevated at 212/140. Labs to rule out end organ damage.    Jesusita Oka 12/14/20 1926    Gilda Crease, MD 12/14/20 2330
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
## 2020-12-14 NOTE — ED Triage Notes (Signed)
Pt by EMS for eval of HTN, abnormal EKG. Daughter checked BP, noted HTN & called EMS, EMS noted abnormal EKG- elevation L1,2,3. Hx TIA approx 54mos ago, HTN meds began after  200/90->180/90 on arrival A&O4

## 2020-12-15 MED ORDER — LABETALOL HCL 5 MG/ML IV SOLN
10.0000 mg | Freq: Once | INTRAVENOUS | Status: AC
  Administered 2020-12-15: 10 mg via INTRAVENOUS
  Filled 2020-12-15: qty 4

## 2020-12-15 MED ORDER — ALPRAZOLAM 0.25 MG PO TABS
0.5000 mg | ORAL_TABLET | Freq: Once | ORAL | Status: AC
  Administered 2020-12-15: 0.5 mg via ORAL
  Filled 2020-12-15: qty 2

## 2021-08-28 ENCOUNTER — Encounter: Payer: Self-pay | Admitting: Cardiology

## 2021-08-28 DIAGNOSIS — R079 Chest pain, unspecified: Secondary | ICD-10-CM

## 2021-08-29 ENCOUNTER — Encounter: Payer: Self-pay | Admitting: Cardiology

## 2021-08-29 DIAGNOSIS — R079 Chest pain, unspecified: Secondary | ICD-10-CM | POA: Diagnosis not present

## 2022-07-09 IMAGING — CT CT HEAD W/O CM
4 series · 16 of 47 positions shown, 18 images · non-contrast
Comparison: Head CT 10/14/2020 and brain MR of 10/16/2020 from
[REDACTED]

CLINICAL DATA: Headache.  Hypertension.

EXAM:
CT HEAD WITHOUT CONTRAST
TECHNIQUE: Contiguous axial images were obtained from the base of the skull
through the vertex without intravenous contrast.

[Series 3: head without · axial · non-contrast · 0.47mm/px · z∈[-151,-31]mm · 7 of 34 slices shown, 9 images]
[im 5/34  brain]
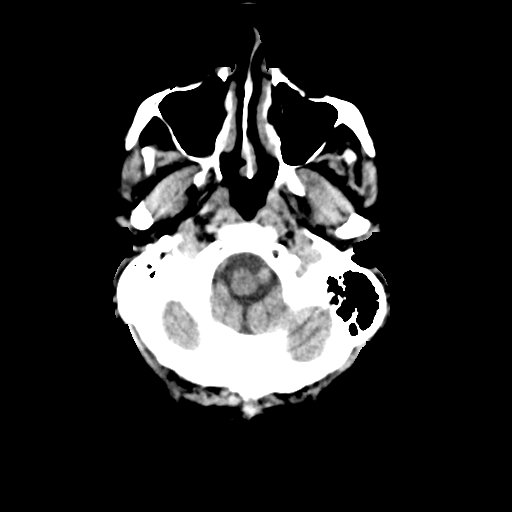
[im 5/34  bone]
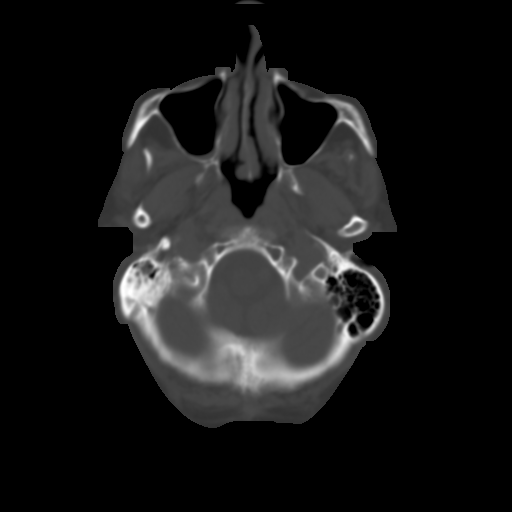
[im 9/34  brain]
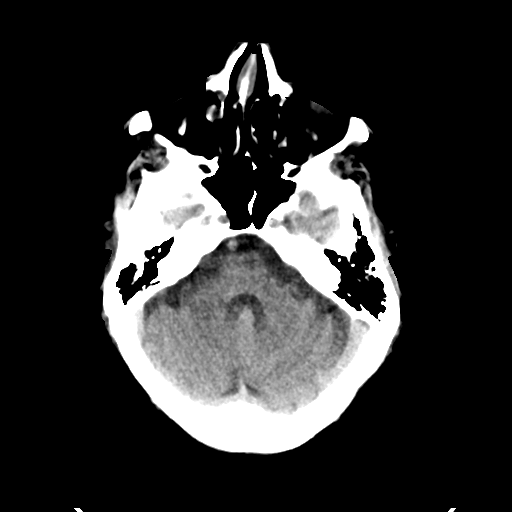
[im 13/34  brain]
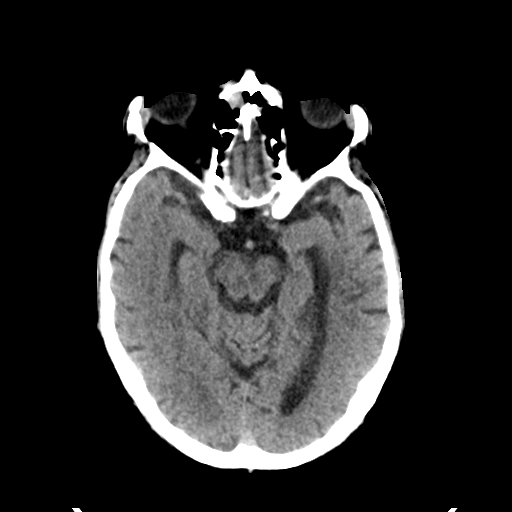
[im 17/34  brain]
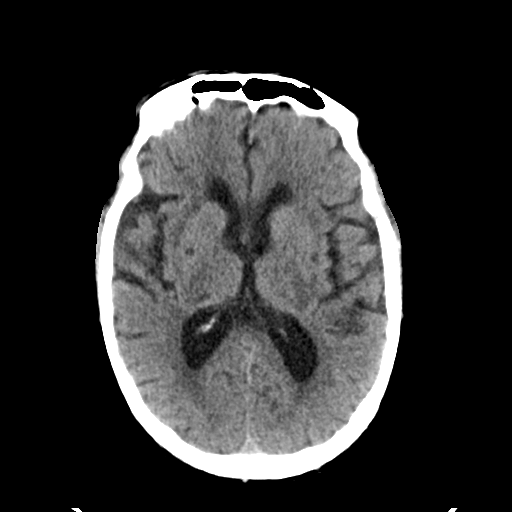
[im 21/34  brain]
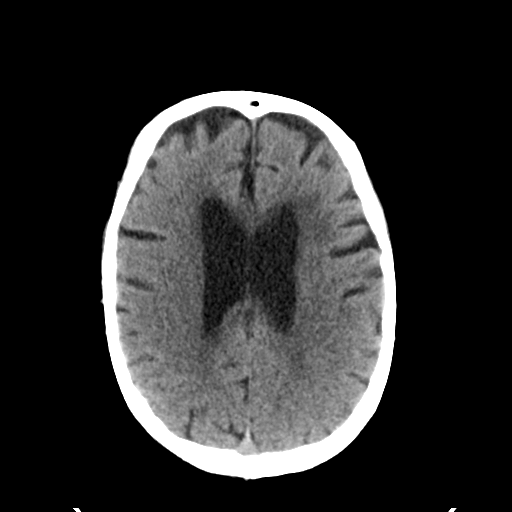
[im 21/34  bone]
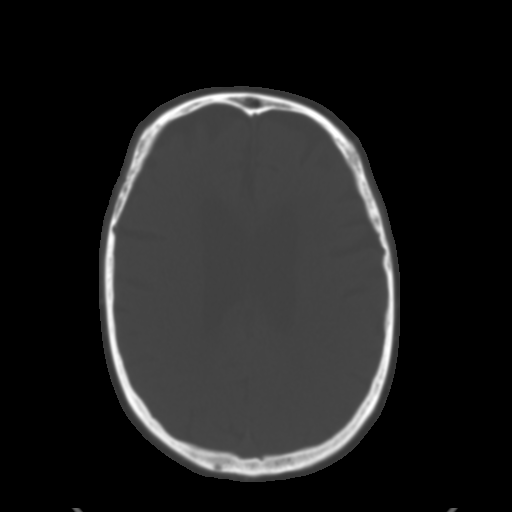
[im 25/34  brain]
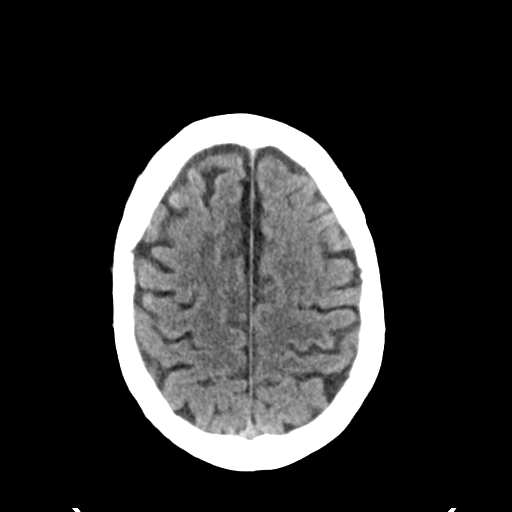
[im 29/34  brain]
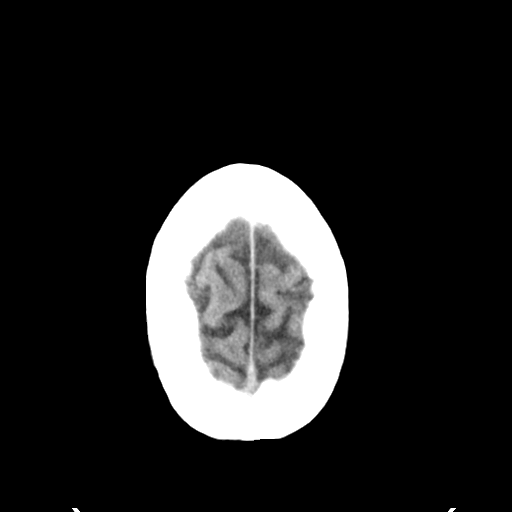

[Series 4: head bone · axial · 0.47mm/px · z∈[-155,-123]mm · 3 of 84 slices shown]
[im 9/84  bone]
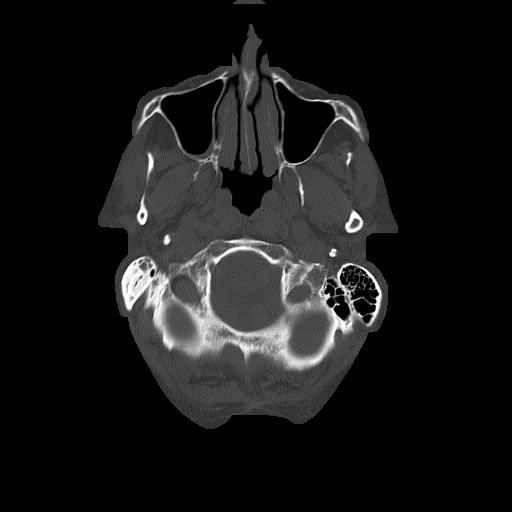
[im 17/84  bone]
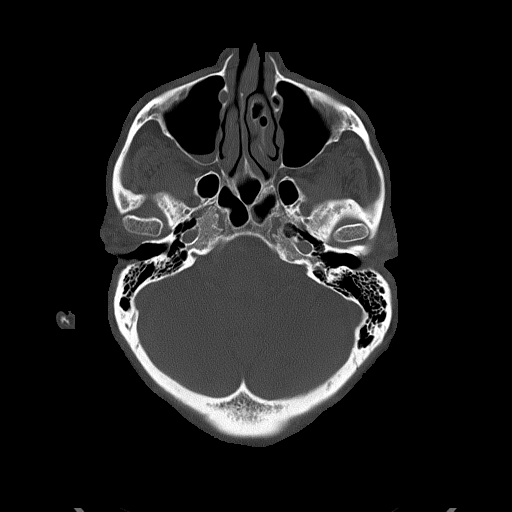
[im 25/84  bone]
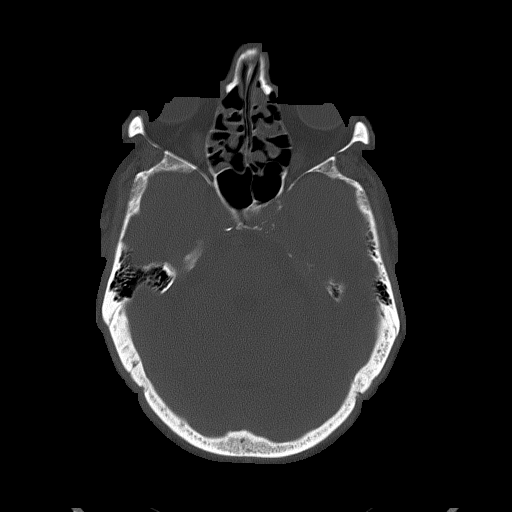

[Series 5: head without cor · coronal · non-contrast · 0.34mm/px · 3 of 78 slices shown]
[im 26/78  brain]
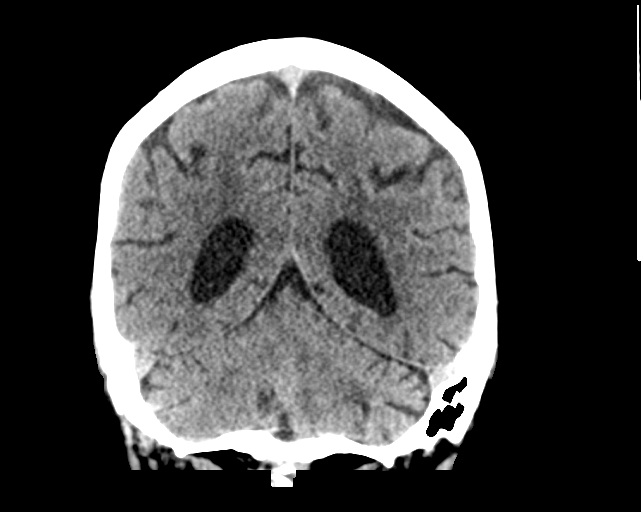
[im 35/78  brain]
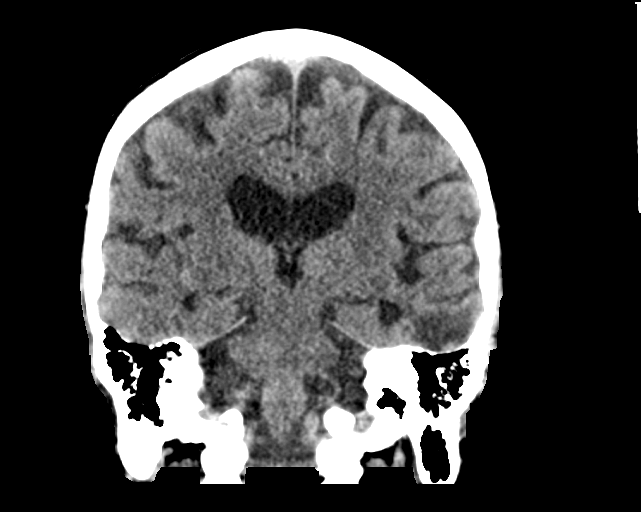
[im 43/78  brain]
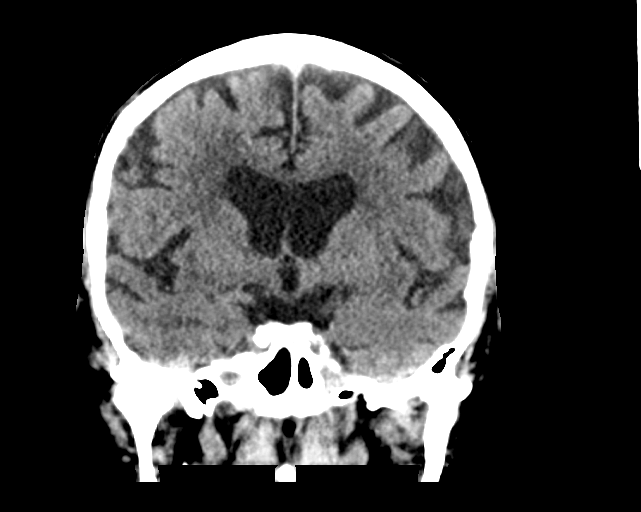

[Series 6: head without sag · sagittal · non-contrast · 0.36mm/px · 3 of 67 slices shown]
[im 23/67  brain]
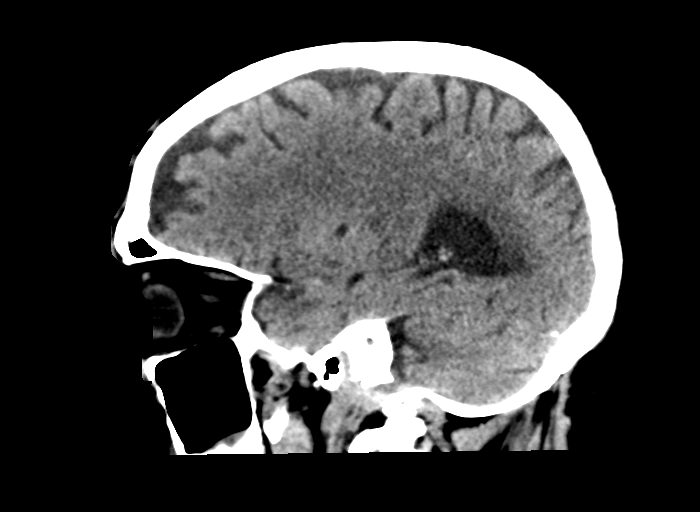
[im 34/67  brain]
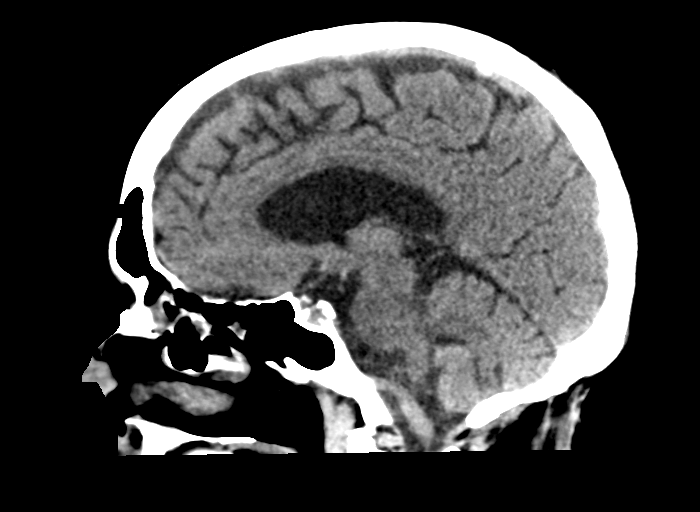
[im 45/67  brain]
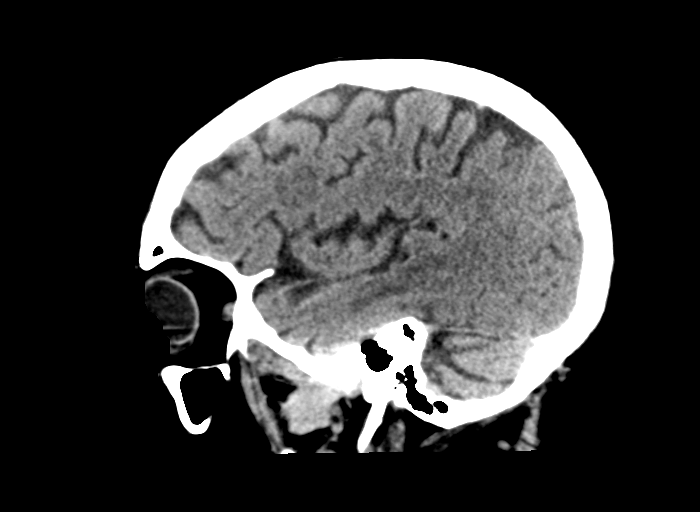

[16 of 47 positions shown; findings below may reference images not displayed]

FINDINGS: Brain: cerebral volume loss likely age expected. Mild low density in
the periventricular white matter likely related to small vessel
disease. Remote bilateral lacunar infarcts. No mass lesion,
hemorrhage, hydrocephalus, acute infarct, intra-axial, or
extra-axial fluid collection.

Vascular: No hyperdense vessel or unexpected calcification.

Skull: Normal

Sinuses/Orbits: Normal imaged portions of the orbits and globes.
Fluid and mucosal thickening of right maxillary sinus. Mucosal
thickening ethmoid air cells and frontal sinuses. Trace right
mastoid fluid.

Other: None.
IMPRESSION: 1. No acute intracranial abnormality.
2. Cerebral atrophy and small vessel ischemic change.
3. Sinus disease.
4. Trace right mastoid fluid.

## 2023-05-12 ENCOUNTER — Other Ambulatory Visit: Payer: Self-pay | Admitting: Specialist

## 2023-05-12 DIAGNOSIS — C159 Malignant neoplasm of esophagus, unspecified: Secondary | ICD-10-CM

## 2023-05-18 ENCOUNTER — Other Ambulatory Visit: Payer: Self-pay | Admitting: Oncology

## 2023-05-18 DIAGNOSIS — C155 Malignant neoplasm of lower third of esophagus: Secondary | ICD-10-CM
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
## 2023-05-19 ENCOUNTER — Encounter: Payer: Self-pay | Admitting: Radiation Oncology

## 2023-05-19 ENCOUNTER — Other Ambulatory Visit: Payer: Self-pay | Admitting: Oncology

## 2023-05-19 ENCOUNTER — Inpatient Hospital Stay: Payer: Medicare PPO

## 2023-05-19 ENCOUNTER — Inpatient Hospital Stay: Payer: Medicare PPO | Attending: Oncology | Admitting: Oncology

## 2023-05-19 ENCOUNTER — Ambulatory Visit
Admission: RE | Admit: 2023-05-19 | Discharge: 2023-05-19 | Disposition: A | Discharge status: Home / Self Care | Payer: Medicare PPO | Source: Ambulatory Visit | Attending: Radiation Oncology | Admitting: Radiation Oncology

## 2023-05-19 ENCOUNTER — Encounter: Payer: Self-pay | Admitting: Oncology

## 2023-05-19 VITALS — BP 120/75 | HR 72 | Temp 98.5°F | Resp 16 | Ht 66.54 in | Wt 160.6 lb

## 2023-05-19 DIAGNOSIS — Z808 Family history of malignant neoplasm of other organs or systems: Secondary | ICD-10-CM

## 2023-05-19 DIAGNOSIS — Z87891 Personal history of nicotine dependence: Secondary | ICD-10-CM

## 2023-05-19 DIAGNOSIS — C7802 Secondary malignant neoplasm of left lung: Secondary | ICD-10-CM | POA: Insufficient documentation

## 2023-05-19 DIAGNOSIS — R1310 Dysphagia, unspecified: Secondary | ICD-10-CM | POA: Diagnosis not present

## 2023-05-19 DIAGNOSIS — C7801 Secondary malignant neoplasm of right lung: Secondary | ICD-10-CM | POA: Diagnosis not present

## 2023-05-19 DIAGNOSIS — C155 Malignant neoplasm of lower third of esophagus: Secondary | ICD-10-CM

## 2023-05-19 DIAGNOSIS — C159 Malignant neoplasm of esophagus, unspecified: Secondary | ICD-10-CM | POA: Diagnosis present

## 2023-05-19 DIAGNOSIS — Z800 Family history of malignant neoplasm of digestive organs: Secondary | ICD-10-CM

## 2023-05-19 DIAGNOSIS — C778 Secondary and unspecified malignant neoplasm of lymph nodes of multiple regions: Secondary | ICD-10-CM | POA: Insufficient documentation

## 2023-05-19 DIAGNOSIS — C787 Secondary malignant neoplasm of liver and intrahepatic bile duct: Secondary | ICD-10-CM | POA: Insufficient documentation

## 2023-05-19 DIAGNOSIS — Z8042 Family history of malignant neoplasm of prostate: Secondary | ICD-10-CM

## 2023-05-19 DIAGNOSIS — Z8051 Family history of malignant neoplasm of kidney: Secondary | ICD-10-CM

## 2023-05-19 DIAGNOSIS — R7303 Prediabetes: Secondary | ICD-10-CM

## 2023-05-19 LAB — CBC WITH DIFFERENTIAL (CANCER CENTER ONLY)
Abs Immature Granulocytes: 0.03 10*3/uL (ref 0.00–0.07)
Basophils Absolute: 0 10*3/uL (ref 0.0–0.1)
Basophils Relative: 1 %
Eosinophils Absolute: 0.1 10*3/uL (ref 0.0–0.5)
Eosinophils Relative: 2 %
HCT: 31.9 % — ABNORMAL LOW (ref 39.0–52.0)
Hemoglobin: 10.4 g/dL — ABNORMAL LOW (ref 13.0–17.0)
Immature Granulocytes: 1 %
Lymphocytes Relative: 9 %
Lymphs Abs: 0.6 10*3/uL — ABNORMAL LOW (ref 0.7–4.0)
MCH: 25.3 pg — ABNORMAL LOW (ref 26.0–34.0)
MCHC: 32.6 g/dL (ref 30.0–36.0)
MCV: 77.6 fL — ABNORMAL LOW (ref 80.0–100.0)
Monocytes Absolute: 0.8 10*3/uL (ref 0.1–1.0)
Monocytes Relative: 12 %
Neutro Abs: 5 10*3/uL (ref 1.7–7.7)
Neutrophils Relative %: 75 %
Platelet Count: 190 10*3/uL (ref 150–400)
RBC: 4.11 MIL/uL — ABNORMAL LOW (ref 4.22–5.81)
RDW: 19.4 % — ABNORMAL HIGH (ref 11.5–15.5)
WBC Count: 6.5 10*3/uL (ref 4.0–10.5)
nRBC: 0 % (ref 0.0–0.2)
nRBC: 0 /100{WBCs}

## 2023-05-19 LAB — CMP (CANCER CENTER ONLY)
ALT: <5 U/L (ref 0–44)
AST: 11 U/L — ABNORMAL LOW (ref 15–41)
Albumin: 4.4 g/dL (ref 3.5–5.0)
Alkaline Phosphatase: 94 U/L (ref 38–126)
Anion gap: 14 (ref 5–15)
BUN: 12 mg/dL (ref 8–23)
CO2: 24 mmol/L (ref 22–32)
Calcium: 10.2 mg/dL (ref 8.9–10.3)
Chloride: 100 mmol/L (ref 98–111)
Creatinine: 1.05 mg/dL (ref 0.61–1.24)
GFR, Estimated: >60 mL/min (ref >60)
Glucose, Bld: 74 mg/dL (ref 70–99)
Potassium: 4.2 mmol/L (ref 3.5–5.1)
Sodium: 138 mmol/L (ref 135–145)
Total Bilirubin: 1.5 mg/dL — ABNORMAL HIGH (ref <1.2)
Total Protein: 7.4 g/dL (ref 6.5–8.1)
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
## 2023-05-21 ENCOUNTER — Other Ambulatory Visit: Payer: Medicare HMO

## 2023-05-21 ENCOUNTER — Telehealth: Payer: Self-pay | Admitting: Dietician
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
## 2023-05-23 ENCOUNTER — Other Ambulatory Visit: Payer: Medicare HMO

## 2023-05-30 ENCOUNTER — Inpatient Hospital Stay (HOSPITAL_BASED_OUTPATIENT_CLINIC_OR_DEPARTMENT_OTHER): Payer: Medicare PPO | Admitting: Oncology

## 2023-05-30 VITALS — BP 149/80 | HR 58 | Temp 98.2°F | Resp 16 | Ht 66.54 in | Wt 163.8 lb

## 2023-05-30 DIAGNOSIS — C7801 Secondary malignant neoplasm of right lung: Secondary | ICD-10-CM

## 2023-05-30 DIAGNOSIS — C159 Malignant neoplasm of esophagus, unspecified: Secondary | ICD-10-CM | POA: Diagnosis not present

## 2023-05-30 DIAGNOSIS — C7802 Secondary malignant neoplasm of left lung: Secondary | ICD-10-CM | POA: Diagnosis not present

## 2023-05-30 DIAGNOSIS — C778 Secondary and unspecified malignant neoplasm of lymph nodes of multiple regions: Secondary | ICD-10-CM

## 2023-05-30 DIAGNOSIS — C155 Malignant neoplasm of lower third of esophagus: Secondary | ICD-10-CM

## 2023-05-30 DIAGNOSIS — C787 Secondary malignant neoplasm of liver and intrahepatic bile duct: Secondary | ICD-10-CM

## 2023-05-30 NOTE — Progress Notes (Unsigned)
[facility]  [address] [place],  [place]  [address] [phone]  Clinic Day:  05/30/2023  Referring physician: Gordan Payment., MD   HISTORY OF PRESENT ILLNESS:  The patient is a 77 y.o. male who I recently again seen for esophageal adenocarcinoma.  He comes in today to go over his PET scan images and the implications.  Of note, this gentleman continues to have problems with dysphagia, for which she has to finely chew his food and drink water afterwards to get it down.  He denies having any significant pain at this time.   PHYSICAL EXAM:  There were no vitals taken for this visit. Wt Readings from Last 3 Encounters:  05/19/23 160 lb 9.6 oz (72.8 kg)  04/27/18 181 lb (82.1 kg)  03/24/15 147 lb (66.7 kg)   There is no height or weight on file to calculate BMI. Performance status (ECOG): 1 - Symptomatic but completely ambulatory Physical Exam Constitutional:      Appearance: Normal appearance. He is not ill-appearing.  HENT:     Mouth/Throat:     Mouth: Mucous membranes are moist.     Pharynx: Oropharynx is clear. No oropharyngeal exudate or posterior oropharyngeal erythema.  Cardiovascular:     Rate and Rhythm: Normal rate and regular rhythm.     Heart sounds: No murmur heard.    No friction rub. No gallop.  Pulmonary:     Effort: Pulmonary effort is normal. No respiratory distress.     Breath sounds: Normal breath sounds. No wheezing, rhonchi or rales.  Abdominal:     General: Bowel sounds are normal. There is no distension.     Palpations: Abdomen is soft. There is no mass.     Tenderness: There is no abdominal tenderness.  Musculoskeletal:        General: No swelling.     Right lower leg: No edema.     Left lower leg: No edema.  Lymphadenopathy:     Cervical: No cervical adenopathy.     Upper Body:     Right upper body: No supraclavicular or axillary adenopathy.     Left upper body: No supraclavicular or axillary adenopathy.      Lower Body: No right inguinal adenopathy. No left inguinal adenopathy.  Skin:    General: Skin is warm.     Coloration: Skin is not jaundiced.     Findings: No lesion or rash.  Neurological:     General: No focal deficit present.     Mental Status: He is alert and oriented to person, place, and time. Mental status is at baseline.  Psychiatric:        Mood and Affect: Mood normal.        Behavior: Behavior normal.        Thought Content: Thought content normal.    LABS:      Latest Ref Rng & Units 05/19/2023    2:46 PM 12/14/2020    7:30 PM 04/29/2018    5:22 AM  CBC  WBC 4.0 - 10.5 K/uL 6.5  6.1  13.5   Hemoglobin 13.0 - 17.0 g/dL 84.1  66.0  63.0   Hematocrit 39.0 - 52.0 % 31.9  34.9  34.6   Platelets 150 - 400 K/uL 190  175  155       Latest Ref Rng & Units 05/19/2023    2:46 PM 12/14/2020    7:30 PM 04/29/2018    5:22 AM  CMP  Glucose 70 - 99 mg/dL 74  353  614   BUN 8 - 23 mg/dL 12  12  19    Creatinine 0.61 - 1.24 mg/dL 4.31  5.40  0.86   Sodium 135 - 145 mmol/L 138  135  142   Potassium 3.5 - 5.1 mmol/L 4.2  3.8  3.9   Chloride 98 - 111 mmol/L 100  99  107   CO2 22 - 32 mmol/L 24  27  27    Calcium 8.9 - 10.3 mg/dL 76.1  9.5  9.5   Total Protein 6.5 - 8.1 g/dL 7.4     Total Bilirubin <1.2 mg/dL 1.5     Alkaline Phos 38 - 126 U/L 94     AST 15 - 41 U/L 11     ALT 0 - 44 U/L <5       STUDIES: His PET scan done yesterday revealed the following:  No results found.  PET SCAN RESULTS PENDING  ASSESSMENT & PLAN:  Assessment/Plan:  A 77 y.o. male whose PET scan unfortunately shows evidence of widespread disease metastasis.  In clinic today, I went over his PET scan images with him, for which he could see his disease involving his lungs, liver, mediastinal and retroperitoneal lymph nodes.  He understands that he is ultimately dealing with incurable disease.  I did discuss with him the possibility of giving him palliative therapy to keep his disease under control for as  long as possible.  However, after mulling over potential options, he ultimately wanted to focus on comfort care only.  Based upon this, I will refer him to hospice for end-of-life care.  I believe his life expectancy will likely be less than 4 months.  Nevertheless, the goal will be to keep his daily quality of life at a decent level for as long as possible.  No scheduled follow-up visits will be made.  However, the patient and his family know to contact our office at any time if they have additional concerns regarding any aspects of his comfort care management.  The patient understands all the plans discussed today and is in agreement with them.    Shalise Rosado Kirby Funk, MD

## 2023-06-02 DIAGNOSIS — C155 Malignant neoplasm of lower third of esophagus: Secondary | ICD-10-CM | POA: Insufficient documentation

## 2023-06-12 ENCOUNTER — Other Ambulatory Visit: Payer: Self-pay

## 2023-06-12 NOTE — Progress Notes (Signed)
 This information is for discussion at Tumor Board only and not part of the official chart.

## 2023-07-05 DEATH — deceased
